# Patient Record
Sex: Male | Born: 1935 | Race: White | Hispanic: No | Smoking: Never smoker
Health system: Southern US, Community
[De-identification: ages and names within clinical notes are randomized; demographics above are authoritative.]

## PROBLEM LIST (undated history)

## (undated) DIAGNOSIS — T300 Burn of unspecified body region, unspecified degree: Secondary | ICD-10-CM

## (undated) DIAGNOSIS — I509 Heart failure, unspecified: Secondary | ICD-10-CM

## (undated) DIAGNOSIS — I10 Essential (primary) hypertension: Secondary | ICD-10-CM

## (undated) DIAGNOSIS — I739 Peripheral vascular disease, unspecified: Secondary | ICD-10-CM

## (undated) DIAGNOSIS — I4891 Unspecified atrial fibrillation: Secondary | ICD-10-CM

## (undated) DIAGNOSIS — I2510 Atherosclerotic heart disease of native coronary artery without angina pectoris: Secondary | ICD-10-CM

## (undated) DIAGNOSIS — E785 Hyperlipidemia, unspecified: Secondary | ICD-10-CM

## (undated) HISTORY — PX: SKIN GRAFT: SHX250

---

## 2008-12-03 DIAGNOSIS — D696 Thrombocytopenia, unspecified: Secondary | ICD-10-CM | POA: Diagnosis present

## 2009-06-22 ENCOUNTER — Emergency Department (HOSPITAL_COMMUNITY): Admission: EM | Admit: 2009-06-22 | Discharge: 2009-06-22 | Payer: Self-pay | Admitting: Family Medicine

## 2014-04-29 HISTORY — PX: SUPRAVALVULAR AORTIC STENOSIS REPAIR: SHX2475

## 2015-01-21 ENCOUNTER — Encounter (HOSPITAL_COMMUNITY): Payer: Self-pay

## 2015-01-21 ENCOUNTER — Emergency Department (HOSPITAL_COMMUNITY): Payer: Medicare Other

## 2015-01-21 ENCOUNTER — Inpatient Hospital Stay (HOSPITAL_COMMUNITY)
Admission: EM | Admit: 2015-01-21 | Discharge: 2015-01-27 | DRG: 177 | Disposition: A | Discharge status: Home / Self Care | Payer: Medicare Other | Attending: Family Medicine | Admitting: Family Medicine

## 2015-01-21 DIAGNOSIS — R197 Diarrhea, unspecified: Secondary | ICD-10-CM | POA: Insufficient documentation

## 2015-01-21 DIAGNOSIS — K226 Gastro-esophageal laceration-hemorrhage syndrome: Secondary | ICD-10-CM | POA: Diagnosis present

## 2015-01-21 DIAGNOSIS — R0602 Shortness of breath: Secondary | ICD-10-CM | POA: Diagnosis not present

## 2015-01-21 DIAGNOSIS — E872 Acidosis, unspecified: Secondary | ICD-10-CM

## 2015-01-21 DIAGNOSIS — R1110 Vomiting, unspecified: Secondary | ICD-10-CM

## 2015-01-21 DIAGNOSIS — E860 Dehydration: Secondary | ICD-10-CM | POA: Diagnosis present

## 2015-01-21 DIAGNOSIS — K922 Gastrointestinal hemorrhage, unspecified: Secondary | ICD-10-CM | POA: Diagnosis present

## 2015-01-21 DIAGNOSIS — G9340 Encephalopathy, unspecified: Secondary | ICD-10-CM | POA: Diagnosis not present

## 2015-01-21 DIAGNOSIS — J690 Pneumonitis due to inhalation of food and vomit: Secondary | ICD-10-CM | POA: Diagnosis not present

## 2015-01-21 DIAGNOSIS — R1312 Dysphagia, oropharyngeal phase: Secondary | ICD-10-CM | POA: Diagnosis present

## 2015-01-21 DIAGNOSIS — R0902 Hypoxemia: Secondary | ICD-10-CM

## 2015-01-21 DIAGNOSIS — E871 Hypo-osmolality and hyponatremia: Secondary | ICD-10-CM | POA: Diagnosis present

## 2015-01-21 DIAGNOSIS — Z7982 Long term (current) use of aspirin: Secondary | ICD-10-CM

## 2015-01-21 DIAGNOSIS — E870 Hyperosmolality and hypernatremia: Secondary | ICD-10-CM | POA: Diagnosis not present

## 2015-01-21 DIAGNOSIS — E876 Hypokalemia: Secondary | ICD-10-CM | POA: Diagnosis present

## 2015-01-21 DIAGNOSIS — K529 Noninfective gastroenteritis and colitis, unspecified: Secondary | ICD-10-CM | POA: Diagnosis present

## 2015-01-21 DIAGNOSIS — Z953 Presence of xenogenic heart valve: Secondary | ICD-10-CM

## 2015-01-21 DIAGNOSIS — Z7902 Long term (current) use of antithrombotics/antiplatelets: Secondary | ICD-10-CM

## 2015-01-21 DIAGNOSIS — E785 Hyperlipidemia, unspecified: Secondary | ICD-10-CM | POA: Diagnosis present

## 2015-01-21 DIAGNOSIS — I4891 Unspecified atrial fibrillation: Secondary | ICD-10-CM

## 2015-01-21 DIAGNOSIS — I480 Paroxysmal atrial fibrillation: Secondary | ICD-10-CM | POA: Diagnosis present

## 2015-01-21 DIAGNOSIS — N179 Acute kidney failure, unspecified: Secondary | ICD-10-CM | POA: Diagnosis present

## 2015-01-21 DIAGNOSIS — K920 Hematemesis: Secondary | ICD-10-CM | POA: Insufficient documentation

## 2015-01-21 DIAGNOSIS — Z66 Do not resuscitate: Secondary | ICD-10-CM | POA: Diagnosis present

## 2015-01-21 DIAGNOSIS — J9601 Acute respiratory failure with hypoxia: Secondary | ICD-10-CM | POA: Diagnosis present

## 2015-01-21 HISTORY — DX: Unspecified atrial fibrillation: I48.91

## 2015-01-21 HISTORY — DX: Burn of unspecified body region, unspecified degree: T30.0

## 2015-01-21 HISTORY — DX: Hyperlipidemia, unspecified: E78.5

## 2015-01-21 LAB — COMPREHENSIVE METABOLIC PANEL
ALK PHOS: 63 U/L (ref 38–126)
ALT: 21 U/L (ref 17–63)
AST: 31 U/L (ref 15–41)
Albumin: 3.6 g/dL (ref 3.5–5.0)
Anion gap: 13 (ref 5–15)
BILIRUBIN TOTAL: 1.3 mg/dL — AB (ref 0.3–1.2)
BUN: 25 mg/dL — ABNORMAL HIGH (ref 6–20)
CALCIUM: 8.4 mg/dL — AB (ref 8.9–10.3)
CO2: 19 mmol/L — AB (ref 22–32)
CREATININE: 1.32 mg/dL — AB (ref 0.61–1.24)
Chloride: 107 mmol/L (ref 101–111)
GFR calc non Af Amer: 46 mL/min — ABNORMAL LOW (ref >60)
GFR, EST AFRICAN AMERICAN: 54 mL/min — AB (ref >60)
Glucose, Bld: 196 mg/dL — ABNORMAL HIGH (ref 65–99)
Potassium: 3.4 mmol/L — ABNORMAL LOW (ref 3.5–5.1)
SODIUM: 139 mmol/L (ref 135–145)
Total Protein: 6.7 g/dL (ref 6.5–8.1)

## 2015-01-21 LAB — CBC WITH DIFFERENTIAL/PLATELET
Basophils Absolute: 0 10*3/uL (ref 0.0–0.1)
Basophils Relative: 0 %
EOS ABS: 0 10*3/uL (ref 0.0–0.7)
Eosinophils Relative: 0 %
HEMATOCRIT: 46.8 % (ref 39.0–52.0)
HEMOGLOBIN: 15.7 g/dL (ref 13.0–17.0)
LYMPHS ABS: 0.3 10*3/uL — AB (ref 0.7–4.0)
LYMPHS PCT: 7 %
MCH: 31.3 pg (ref 26.0–34.0)
MCHC: 33.5 g/dL (ref 30.0–36.0)
MCV: 93.2 fL (ref 78.0–100.0)
Monocytes Absolute: 0.1 10*3/uL (ref 0.1–1.0)
Monocytes Relative: 3 %
NEUTROS ABS: 4 10*3/uL (ref 1.7–7.7)
NEUTROS PCT: 90 %
Platelets: 133 10*3/uL — ABNORMAL LOW (ref 150–400)
RBC: 5.02 MIL/uL (ref 4.22–5.81)
RDW: 13.9 % (ref 11.5–15.5)
WBC: 4.5 10*3/uL (ref 4.0–10.5)

## 2015-01-21 LAB — POC OCCULT BLOOD, ED: Fecal Occult Bld: POSITIVE — AB

## 2015-01-21 LAB — TYPE AND SCREEN
ABO/RH(D): O POS
Antibody Screen: NEGATIVE

## 2015-01-21 LAB — I-STAT CG4 LACTIC ACID, ED: LACTIC ACID, VENOUS: 3.39 mmol/L — AB (ref 0.5–2.0)

## 2015-01-21 LAB — LIPASE, BLOOD: Lipase: 42 U/L (ref 22–51)

## 2015-01-21 MED ORDER — SODIUM CHLORIDE 0.9 % IV SOLN
80.0000 mg | Freq: Once | INTRAVENOUS | Status: AC
  Administered 2015-01-21: 80 mg via INTRAVENOUS
  Filled 2015-01-21: qty 80

## 2015-01-21 MED ORDER — PIPERACILLIN-TAZOBACTAM 3.375 G IVPB 30 MIN
3.3750 g | Freq: Once | INTRAVENOUS | Status: DC
  Filled 2015-01-21: qty 50

## 2015-01-21 MED ORDER — ONDANSETRON HCL 4 MG/2ML IJ SOLN
4.0000 mg | Freq: Once | INTRAMUSCULAR | Status: AC
  Administered 2015-01-21: 4 mg via INTRAVENOUS
  Filled 2015-01-21: qty 2

## 2015-01-21 MED ORDER — SODIUM CHLORIDE 0.9 % IV SOLN
8.0000 mg/h | INTRAVENOUS | Status: DC
  Administered 2015-01-21 – 2015-01-22 (×2): 8 mg/h via INTRAVENOUS
  Filled 2015-01-21 (×4): qty 80

## 2015-01-21 MED ORDER — PANTOPRAZOLE SODIUM 40 MG IV SOLR: 40.0000 mg | Freq: Two times a day (BID) | INTRAVENOUS | Status: DC

## 2015-01-21 MED ORDER — SODIUM CHLORIDE 0.9 % IV BOLUS (SEPSIS)
1000.0000 mL | Freq: Once | INTRAVENOUS | Status: AC
  Administered 2015-01-21: 1000 mL via INTRAVENOUS
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
## 2015-01-21 NOTE — ED Notes (Signed)
Per EMS pt had sudden onset of n/v/d at 1800; Pt had family member visit this past week with virus with similar symptoms; pt became incontinent of bowel; EMS witnessed bloody emesis on arrival; Pt has low sats in route and placed on 2 L/M 02 for precaution; Pt a&o on arrival; Pt has severe SOB on arrival and placed on non re breather for sats to get to 90%; Respiratory paged on arrival. Pt is currrently in afib; Pt has hx of dementia per family at bedside. Family states new onset of afib no hx known.

## 2015-01-21 NOTE — ED Notes (Signed)
Patient transported to X-ray 

## 2015-01-21 NOTE — ED Notes (Signed)
MD at bedside. 

## 2015-01-22 ENCOUNTER — Encounter (HOSPITAL_COMMUNITY): Payer: Self-pay | Admitting: Internal Medicine

## 2015-01-22 DIAGNOSIS — Z66 Do not resuscitate: Secondary | ICD-10-CM | POA: Diagnosis present

## 2015-01-22 DIAGNOSIS — Z953 Presence of xenogenic heart valve: Secondary | ICD-10-CM | POA: Diagnosis not present

## 2015-01-22 DIAGNOSIS — K922 Gastrointestinal hemorrhage, unspecified: Secondary | ICD-10-CM | POA: Diagnosis present

## 2015-01-22 DIAGNOSIS — J690 Pneumonitis due to inhalation of food and vomit: Secondary | ICD-10-CM | POA: Diagnosis present

## 2015-01-22 DIAGNOSIS — E871 Hypo-osmolality and hyponatremia: Secondary | ICD-10-CM | POA: Diagnosis present

## 2015-01-22 DIAGNOSIS — E876 Hypokalemia: Secondary | ICD-10-CM | POA: Diagnosis present

## 2015-01-22 DIAGNOSIS — Z7902 Long term (current) use of antithrombotics/antiplatelets: Secondary | ICD-10-CM | POA: Diagnosis not present

## 2015-01-22 DIAGNOSIS — R1312 Dysphagia, oropharyngeal phase: Secondary | ICD-10-CM | POA: Diagnosis present

## 2015-01-22 DIAGNOSIS — E860 Dehydration: Secondary | ICD-10-CM | POA: Diagnosis present

## 2015-01-22 DIAGNOSIS — E872 Acidosis: Secondary | ICD-10-CM

## 2015-01-22 DIAGNOSIS — G9340 Encephalopathy, unspecified: Secondary | ICD-10-CM | POA: Diagnosis not present

## 2015-01-22 DIAGNOSIS — E785 Hyperlipidemia, unspecified: Secondary | ICD-10-CM | POA: Diagnosis present

## 2015-01-22 DIAGNOSIS — R0602 Shortness of breath: Secondary | ICD-10-CM | POA: Diagnosis present

## 2015-01-22 DIAGNOSIS — I4891 Unspecified atrial fibrillation: Secondary | ICD-10-CM | POA: Diagnosis present

## 2015-01-22 DIAGNOSIS — I480 Paroxysmal atrial fibrillation: Secondary | ICD-10-CM | POA: Diagnosis present

## 2015-01-22 DIAGNOSIS — J9601 Acute respiratory failure with hypoxia: Secondary | ICD-10-CM | POA: Diagnosis present

## 2015-01-22 DIAGNOSIS — R110 Nausea: Secondary | ICD-10-CM

## 2015-01-22 DIAGNOSIS — Z7982 Long term (current) use of aspirin: Secondary | ICD-10-CM | POA: Diagnosis not present

## 2015-01-22 DIAGNOSIS — E870 Hyperosmolality and hypernatremia: Secondary | ICD-10-CM | POA: Diagnosis not present

## 2015-01-22 DIAGNOSIS — K529 Noninfective gastroenteritis and colitis, unspecified: Secondary | ICD-10-CM | POA: Diagnosis present

## 2015-01-22 DIAGNOSIS — N179 Acute kidney failure, unspecified: Secondary | ICD-10-CM | POA: Diagnosis present

## 2015-01-22 DIAGNOSIS — R197 Diarrhea, unspecified: Secondary | ICD-10-CM | POA: Insufficient documentation

## 2015-01-22 DIAGNOSIS — K226 Gastro-esophageal laceration-hemorrhage syndrome: Secondary | ICD-10-CM | POA: Diagnosis present

## 2015-01-22 DIAGNOSIS — K920 Hematemesis: Secondary | ICD-10-CM | POA: Insufficient documentation

## 2015-01-22 LAB — CBC
HCT: 42.4 % (ref 39.0–52.0)
HCT: 43.5 % (ref 39.0–52.0)
Hemoglobin: 14.1 g/dL (ref 13.0–17.0)
Hemoglobin: 14.1 g/dL (ref 13.0–17.0)
MCH: 30.9 pg (ref 26.0–34.0)
MCH: 30.9 pg (ref 26.0–34.0)
MCHC: 33.3 g/dL (ref 30.0–36.0)
MCHC: 32.4 g/dL (ref 30.0–36.0)
MCV: 92.8 fL (ref 78.0–100.0)
MCV: 95.4 fL (ref 78.0–100.0)
PLATELETS: 123 10*3/uL — AB (ref 150–400)
PLATELETS: 125 10*3/uL — AB (ref 150–400)
RBC: 4.57 MIL/uL (ref 4.22–5.81)
RBC: 4.56 MIL/uL (ref 4.22–5.81)
RDW: 13.8 % (ref 11.5–15.5)
RDW: 14.4 % (ref 11.5–15.5)
WBC: 2.8 10*3/uL — AB (ref 4.0–10.5)
WBC: 7.4 10*3/uL (ref 4.0–10.5)

## 2015-01-22 LAB — MAGNESIUM: MAGNESIUM: 1.6 mg/dL — AB (ref 1.7–2.4)

## 2015-01-22 LAB — COMPREHENSIVE METABOLIC PANEL
ALBUMIN: 2.9 g/dL — AB (ref 3.5–5.0)
ALK PHOS: 45 U/L (ref 38–126)
ALT: 16 U/L — AB (ref 17–63)
ANION GAP: 10 (ref 5–15)
AST: 28 U/L (ref 15–41)
BUN: 23 mg/dL — ABNORMAL HIGH (ref 6–20)
CALCIUM: 7.6 mg/dL — AB (ref 8.9–10.3)
CHLORIDE: 111 mmol/L (ref 101–111)
CO2: 19 mmol/L — AB (ref 22–32)
Creatinine, Ser: 1.31 mg/dL — ABNORMAL HIGH (ref 0.61–1.24)
GFR calc Af Amer: 54 mL/min — ABNORMAL LOW (ref >60)
GFR calc non Af Amer: 47 mL/min — ABNORMAL LOW (ref >60)
GLUCOSE: 139 mg/dL — AB (ref 65–99)
Potassium: 3.1 mmol/L — ABNORMAL LOW (ref 3.5–5.1)
SODIUM: 140 mmol/L (ref 135–145)
Total Bilirubin: 0.9 mg/dL (ref 0.3–1.2)
Total Protein: 5.6 g/dL — ABNORMAL LOW (ref 6.5–8.1)

## 2015-01-22 LAB — STREP PNEUMONIAE URINARY ANTIGEN: Strep Pneumo Urinary Antigen: NEGATIVE

## 2015-01-22 LAB — PHOSPHORUS: PHOSPHORUS: 2 mg/dL — AB (ref 2.5–4.6)

## 2015-01-22 LAB — LACTIC ACID, PLASMA
LACTIC ACID, VENOUS: 4.2 mmol/L — AB (ref 0.5–2.0)
LACTIC ACID, VENOUS: 4.6 mmol/L — AB (ref 0.5–2.0)

## 2015-01-22 LAB — ABO/RH: ABO/RH(D): O POS

## 2015-01-22 LAB — MRSA PCR SCREENING: MRSA BY PCR: NEGATIVE

## 2015-01-22 LAB — C DIFFICILE QUICK SCREEN W PCR REFLEX
C DIFFICLE (CDIFF) ANTIGEN: NEGATIVE
C Diff interpretation: NEGATIVE
C Diff toxin: NEGATIVE

## 2015-01-22 LAB — GLUCOSE, CAPILLARY
Glucose-Capillary: 102 mg/dL — ABNORMAL HIGH (ref 65–99)
Glucose-Capillary: 177 mg/dL — ABNORMAL HIGH (ref 65–99)

## 2015-01-22 MED ORDER — PIPERACILLIN-TAZOBACTAM 3.375 G IVPB 30 MIN
3.3750 g | Freq: Once | INTRAVENOUS | Status: AC
  Administered 2015-01-22: 3.375 g via INTRAVENOUS
  Filled 2015-01-22: qty 50

## 2015-01-22 MED ORDER — VANCOMYCIN HCL IN DEXTROSE 1-5 GM/200ML-% IV SOLN
1000.0000 mg | INTRAVENOUS | Status: DC
  Administered 2015-01-23 – 2015-01-25 (×3): 1000 mg via INTRAVENOUS
  Filled 2015-01-22 (×4): qty 200

## 2015-01-22 MED ORDER — LEVALBUTEROL HCL 1.25 MG/0.5ML IN NEBU
1.2500 mg | INHALATION_SOLUTION | Freq: Four times a day (QID) | RESPIRATORY_TRACT | Status: DC
  Filled 2015-01-22 (×2): qty 0.5

## 2015-01-22 MED ORDER — POTASSIUM CHLORIDE IN NACL 40-0.9 MEQ/L-% IV SOLN
INTRAVENOUS | Status: AC
  Administered 2015-01-22: 100 mL/h via INTRAVENOUS
  Filled 2015-01-22: qty 1000

## 2015-01-22 MED ORDER — PANTOPRAZOLE SODIUM 40 MG IV SOLR
40.0000 mg | Freq: Two times a day (BID) | INTRAVENOUS | Status: DC
  Administered 2015-01-22 – 2015-01-26 (×10): 40 mg via INTRAVENOUS
  Filled 2015-01-22 (×10): qty 40

## 2015-01-22 MED ORDER — MAGNESIUM SULFATE 2 GM/50ML IV SOLN
2.0000 g | Freq: Once | INTRAVENOUS | Status: AC
  Administered 2015-01-22: 2 g via INTRAVENOUS
  Filled 2015-01-22: qty 50

## 2015-01-22 MED ORDER — RESOURCE THICKENUP CLEAR PO POWD
ORAL | Status: DC | PRN
  Filled 2015-01-22: qty 125

## 2015-01-22 MED ORDER — LOPERAMIDE HCL 2 MG PO CAPS
4.0000 mg | ORAL_CAPSULE | Freq: Three times a day (TID) | ORAL | Status: DC | PRN
  Administered 2015-01-22: 4 mg via ORAL
  Filled 2015-01-22: qty 2

## 2015-01-22 MED ORDER — CETYLPYRIDINIUM CHLORIDE 0.05 % MT LIQD
7.0000 mL | Freq: Two times a day (BID) | OROMUCOSAL | Status: DC
  Administered 2015-01-22 – 2015-01-27 (×11): 7 mL via OROMUCOSAL

## 2015-01-22 MED ORDER — VANCOMYCIN HCL 10 G IV SOLR
1500.0000 mg | INTRAVENOUS | Status: AC
  Administered 2015-01-22: 1500 mg via INTRAVENOUS
  Filled 2015-01-22 (×2): qty 1500

## 2015-01-22 MED ORDER — POTASSIUM CHLORIDE CRYS ER 20 MEQ PO TBCR
60.0000 meq | EXTENDED_RELEASE_TABLET | Freq: Once | ORAL | Status: AC
  Administered 2015-01-22: 60 meq via ORAL
  Filled 2015-01-22: qty 3

## 2015-01-22 MED ORDER — PIPERACILLIN-TAZOBACTAM 3.375 G IVPB
3.3750 g | Freq: Three times a day (TID) | INTRAVENOUS | Status: DC
  Administered 2015-01-22 – 2015-01-26 (×13): 3.375 g via INTRAVENOUS
  Filled 2015-01-22 (×15): qty 50

## 2015-01-22 MED ORDER — SODIUM CHLORIDE 0.9 % IV SOLN
INTRAVENOUS | Status: DC
Start: 2015-01-22 — End: 2015-01-23
  Administered 2015-01-22 (×2): via INTRAVENOUS

## 2015-01-22 MED ORDER — SODIUM CHLORIDE 0.9 % IV BOLUS (SEPSIS)
500.0000 mL | Freq: Once | INTRAVENOUS | Status: AC
  Administered 2015-01-22: 500 mL via INTRAVENOUS

## 2015-01-22 MED ORDER — LEVALBUTEROL HCL 1.25 MG/0.5ML IN NEBU
1.2500 mg | INHALATION_SOLUTION | Freq: Four times a day (QID) | RESPIRATORY_TRACT | Status: DC
  Administered 2015-01-22 – 2015-01-24 (×10): 1.25 mg via RESPIRATORY_TRACT
  Filled 2015-01-22 (×9): qty 0.5

## 2015-01-22 MED ORDER — IPRATROPIUM BROMIDE 0.02 % IN SOLN
0.5000 mg | Freq: Four times a day (QID) | RESPIRATORY_TRACT | Status: DC
  Administered 2015-01-22 – 2015-01-24 (×10): 0.5 mg via RESPIRATORY_TRACT
  Filled 2015-01-22 (×10): qty 2.5

## 2015-01-22 NOTE — Progress Notes (Signed)
CRITICAL VALUE ALERT  Critical value received: lactic Acid 4.2  Date of notification: 01/22/15  Time of notification:  1055am  Critical value read back:yes  Nurse who received alert: Arman Bogus Rn  MD notified (1st page): Dr Rito Ehrlich Via Text page
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
## 2015-01-22 NOTE — ED Notes (Signed)
MD at bedside. 

## 2015-01-22 NOTE — Progress Notes (Signed)
Utilization review completed.  
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
## 2015-01-23 ENCOUNTER — Inpatient Hospital Stay (HOSPITAL_COMMUNITY): Payer: Medicare Other

## 2015-01-23 DIAGNOSIS — I4891 Unspecified atrial fibrillation: Secondary | ICD-10-CM

## 2015-01-23 DIAGNOSIS — G9340 Encephalopathy, unspecified: Secondary | ICD-10-CM

## 2015-01-23 LAB — GLUCOSE, CAPILLARY
GLUCOSE-CAPILLARY: 143 mg/dL — AB (ref 65–99)
Glucose-Capillary: 90 mg/dL (ref 65–99)
Glucose-Capillary: 101 mg/dL — ABNORMAL HIGH (ref 65–99)

## 2015-01-23 LAB — CBC
HCT: 40.3 % (ref 39.0–52.0)
Hemoglobin: 13.1 g/dL (ref 13.0–17.0)
MCH: 30.5 pg (ref 26.0–34.0)
MCHC: 32.5 g/dL (ref 30.0–36.0)
MCV: 93.9 fL (ref 78.0–100.0)
Platelets: 120 10*3/uL — ABNORMAL LOW (ref 150–400)
RBC: 4.29 MIL/uL (ref 4.22–5.81)
RDW: 14.5 % (ref 11.5–15.5)
WBC: 7.4 10*3/uL (ref 4.0–10.5)

## 2015-01-23 LAB — BASIC METABOLIC PANEL
Anion gap: 6 (ref 5–15)
Anion gap: 8 (ref 5–15)
BUN: 22 mg/dL — AB (ref 6–20)
BUN: 22 mg/dL — AB (ref 6–20)
CALCIUM: 8.4 mg/dL — AB (ref 8.9–10.3)
CHLORIDE: 128 mmol/L — AB (ref 101–111)
CHLORIDE: 122 mmol/L — AB (ref 101–111)
CO2: 18 mmol/L — AB (ref 22–32)
CO2: 19 mmol/L — ABNORMAL LOW (ref 22–32)
CREATININE: 1.3 mg/dL — AB (ref 0.61–1.24)
CREATININE: 1.22 mg/dL (ref 0.61–1.24)
Calcium: 8.2 mg/dL — ABNORMAL LOW (ref 8.9–10.3)
GFR calc Af Amer: 59 mL/min — ABNORMAL LOW (ref >60)
GFR calc non Af Amer: 47 mL/min — ABNORMAL LOW (ref >60)
GFR calc non Af Amer: 51 mL/min — ABNORMAL LOW (ref >60)
GFR, EST AFRICAN AMERICAN: 55 mL/min — AB (ref >60)
GLUCOSE: 134 mg/dL — AB (ref 65–99)
Glucose, Bld: 127 mg/dL — ABNORMAL HIGH (ref 65–99)
POTASSIUM: 3.3 mmol/L — AB (ref 3.5–5.1)
Potassium: 3.7 mmol/L (ref 3.5–5.1)
SODIUM: 152 mmol/L — AB (ref 135–145)
SODIUM: 149 mmol/L — AB (ref 135–145)

## 2015-01-23 LAB — LEGIONELLA PNEUMOPHILA SEROGP 1 UR AG: L. pneumophila Serogp 1 Ur Ag: NEGATIVE

## 2015-01-23 LAB — LACTIC ACID, PLASMA: LACTIC ACID, VENOUS: 2.2 mmol/L — AB (ref 0.5–2.0)

## 2015-01-23 MED ORDER — LORAZEPAM 2 MG/ML IJ SOLN
1.0000 mg | Freq: Once | INTRAMUSCULAR | Status: AC
  Administered 2015-01-23: 1 mg via INTRAVENOUS
  Filled 2015-01-23: qty 1

## 2015-01-23 MED ORDER — LOPERAMIDE HCL 2 MG PO CAPS
4.0000 mg | ORAL_CAPSULE | Freq: Three times a day (TID) | ORAL | Status: AC
  Administered 2015-01-23 – 2015-01-24 (×5): 4 mg via ORAL
  Filled 2015-01-23 (×5): qty 2

## 2015-01-23 MED ORDER — POTASSIUM CHLORIDE 10 MEQ/100ML IV SOLN
10.0000 meq | INTRAVENOUS | Status: AC
  Administered 2015-01-23 (×4): 10 meq via INTRAVENOUS
  Filled 2015-01-23 (×4): qty 100

## 2015-01-23 MED ORDER — SODIUM CHLORIDE 0.45 % IV SOLN
INTRAVENOUS | Status: DC
  Administered 2015-01-23 – 2015-01-25 (×3): via INTRAVENOUS
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
## 2015-01-23 NOTE — Progress Notes (Signed)
Patient very sleepy. Chaplain asked if he would like her to come back another time; he nodded yes.

## 2015-01-23 NOTE — Progress Notes (Signed)
Speech Pathology    MBSS complete. Full report located under chart review in imaging section. Double click on DG swallow function.  Recommend: Continue Dys 1 (puree) and nectar thick liquids, SWALLOW 2 TIMES AFTER FOOD/LIQUID, ALTERNATE SOLIDS AND LIQUIDS.    Breck Coons [place].Ed [employer] [phone]

## 2015-01-23 NOTE — Progress Notes (Signed)
Echocardiogram 2D Echocardiogram has been performed.  Max Martinez 01/23/2015, 12:33 PM

## 2015-01-24 DIAGNOSIS — I4891 Unspecified atrial fibrillation: Secondary | ICD-10-CM

## 2015-01-24 LAB — BASIC METABOLIC PANEL
ANION GAP: 8 (ref 5–15)
BUN: 16 mg/dL (ref 6–20)
CO2: 20 mmol/L — ABNORMAL LOW (ref 22–32)
Calcium: 8 mg/dL — ABNORMAL LOW (ref 8.9–10.3)
Chloride: 116 mmol/L — ABNORMAL HIGH (ref 101–111)
Creatinine, Ser: 1.07 mg/dL (ref 0.61–1.24)
GFR calc Af Amer: >60 mL/min (ref >60)
GFR, EST NON AFRICAN AMERICAN: 60 mL/min — AB (ref >60)
GLUCOSE: 108 mg/dL — AB (ref 65–99)
POTASSIUM: 3.5 mmol/L (ref 3.5–5.1)
Sodium: 144 mmol/L (ref 135–145)

## 2015-01-24 LAB — CBC
HEMATOCRIT: 38.7 % — AB (ref 39.0–52.0)
Hemoglobin: 12.7 g/dL — ABNORMAL LOW (ref 13.0–17.0)
MCH: 30.7 pg (ref 26.0–34.0)
MCHC: 32.8 g/dL (ref 30.0–36.0)
MCV: 93.5 fL (ref 78.0–100.0)
PLATELETS: 119 10*3/uL — AB (ref 150–400)
RBC: 4.14 MIL/uL — AB (ref 4.22–5.81)
RDW: 14.6 % (ref 11.5–15.5)
WBC: 9.3 10*3/uL (ref 4.0–10.5)

## 2015-01-24 LAB — GLUCOSE, CAPILLARY
GLUCOSE-CAPILLARY: 99 mg/dL (ref 65–99)
Glucose-Capillary: 77 mg/dL (ref 65–99)
Glucose-Capillary: 92 mg/dL (ref 65–99)

## 2015-01-24 MED ORDER — LEVALBUTEROL HCL 1.25 MG/0.5ML IN NEBU: 1.2500 mg | INHALATION_SOLUTION | Freq: Four times a day (QID) | RESPIRATORY_TRACT | Status: DC | PRN

## 2015-01-24 MED ORDER — METOPROLOL TARTRATE 25 MG PO TABS
25.0000 mg | ORAL_TABLET | Freq: Two times a day (BID) | ORAL | Status: DC
  Administered 2015-01-24 – 2015-01-27 (×7): 25 mg via ORAL
  Filled 2015-01-24 (×7): qty 1

## 2015-01-24 MED ORDER — IPRATROPIUM BROMIDE 0.02 % IN SOLN: 0.5000 mg | Freq: Four times a day (QID) | RESPIRATORY_TRACT | Status: DC | PRN

## 2015-01-24 MED ORDER — POTASSIUM CHLORIDE 10 MEQ/100ML IV SOLN
10.0000 meq | INTRAVENOUS | Status: AC
  Administered 2015-01-24 (×4): 10 meq via INTRAVENOUS
  Filled 2015-01-24 (×4): qty 100
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
## 2015-01-24 NOTE — Progress Notes (Signed)
Speech Language Pathology Treatment: Dysphagia  Patient Details Name: Max Martinez MRN: [id number] DOB: 06/06/1924 Today's Date: 01/24/2015 Time: 4098-1191 SLP Time Calculation (min) (ACUTE ONLY): 21 min  Assessment / Plan / Recommendation Clinical Impression  Restraint mitt removed providing pt max verbal and tactile cues for self feeding and rate and bite/sip size regulation. He required verbal reminders after each bite/sip throughout meal for second swallow not sensing pharyngeal residue (as seen on MBS) and able to implement once cued. Delayed cough x 3 indicative of likely penetration with hopeful  partial or full expulsion with reflexive cough. Puree texture and nectar thick liquids appropriate to continue with FULL supervision as pt unable to follow strategies without reminders.    HPI Other Pertinent Information: Max Martinez is a 81 y.o. male with a past medical history of status post aortic stenosis repair earlier this year, hyperlipidemia, BPH who was brought to the emergency department due to abdominal pain, nausea, several episodes of emesis and diarrhea. Just 3 days before, his grandson had similar symptoms that lasted less than 24 hours and were self-limited. Per daughter, there seemed to be blood in one of the emesis episodes and EMS witnessed an episode of hematemesis on the way to the hospital. He was also hypoxic and required nonrebreather mask to raise his oxygen to normal levels. His daughter states, that prior to the emesis episodes he was breathing normally. In the ER, he was noticed to be on atrial fibrillation, which the daughter is not aware to be a chronic problem. He had aortic valve replacement surgery about 6 months ago. To the patient's daughter's knowledge, he does not have any other cardiac history. He is currently in no acute distress, but is requiring high FiO2 to maintain oxygen saturations.   Pertinent Vitals Pain Assessment: No/denies pain  SLP Plan  Continue  with current plan of care    Recommendations Diet recommendations: Dysphagia 1 (puree);Nectar-thick liquid Liquids provided via: Cup;No straw Medication Administration: Crushed with puree Supervision: Patient able to self feed;Full supervision/cueing for compensatory strategies Compensations: Slow rate;Small sips/bites;Check for pocketing;Multiple dry swallows after each bite/sip;Follow solids with liquid Postural Changes and/or Swallow Maneuvers: Seated upright 90 degrees              Oral Care Recommendations: Oral care BID Follow up Recommendations: Skilled Nursing facility Plan: Continue with current plan of care    GO     Royce Macadamia 01/24/2015, 9:12 AM   Breck Coons Lonell Face.Ed [employer] [phone]

## 2015-01-25 DIAGNOSIS — J690 Pneumonitis due to inhalation of food and vomit: Principal | ICD-10-CM

## 2015-01-25 LAB — CBC
HEMATOCRIT: 36.4 % — AB (ref 39.0–52.0)
HEMOGLOBIN: 12.3 g/dL — AB (ref 13.0–17.0)
MCH: 31.5 pg (ref 26.0–34.0)
MCHC: 33.8 g/dL (ref 30.0–36.0)
MCV: 93.1 fL (ref 78.0–100.0)
Platelets: 114 10*3/uL — ABNORMAL LOW (ref 150–400)
RBC: 3.91 MIL/uL — AB (ref 4.22–5.81)
RDW: 14.5 % (ref 11.5–15.5)
WBC: 9.5 10*3/uL (ref 4.0–10.5)

## 2015-01-25 LAB — GLUCOSE, CAPILLARY
GLUCOSE-CAPILLARY: 104 mg/dL — AB (ref 65–99)
GLUCOSE-CAPILLARY: 90 mg/dL (ref 65–99)
GLUCOSE-CAPILLARY: 92 mg/dL (ref 65–99)

## 2015-01-25 LAB — BASIC METABOLIC PANEL
ANION GAP: 6 (ref 5–15)
BUN: 20 mg/dL (ref 6–20)
CHLORIDE: 114 mmol/L — AB (ref 101–111)
CO2: 21 mmol/L — AB (ref 22–32)
CREATININE: 1.07 mg/dL (ref 0.61–1.24)
Calcium: 7.7 mg/dL — ABNORMAL LOW (ref 8.9–10.3)
GFR calc non Af Amer: 60 mL/min — ABNORMAL LOW (ref >60)
Glucose, Bld: 115 mg/dL — ABNORMAL HIGH (ref 65–99)
POTASSIUM: 3.5 mmol/L (ref 3.5–5.1)
SODIUM: 141 mmol/L (ref 135–145)

## 2015-01-25 LAB — PHOSPHORUS: PHOSPHORUS: 2.7 mg/dL (ref 2.5–4.6)

## 2015-01-25 MED ORDER — CLOPIDOGREL BISULFATE 75 MG PO TABS
75.0000 mg | ORAL_TABLET | Freq: Every day | ORAL | Status: DC
  Administered 2015-01-26 – 2015-01-27 (×2): 75 mg via ORAL
  Filled 2015-01-25 (×2): qty 1

## 2015-01-25 MED ORDER — INFLUENZA VAC SPLIT QUAD 0.5 ML IM SUSY
0.5000 mL | PREFILLED_SYRINGE | INTRAMUSCULAR | Status: AC
  Administered 2015-01-26: 0.5 mL via INTRAMUSCULAR
  Filled 2015-01-25: qty 0.5

## 2015-01-25 NOTE — Care Management Important Message (Signed)
Important Message  Patient Details  Name: ELDEAN NANNA MRN: [id number] Date of Birth: Jun 27, 1924   Medicare Important Message Given:  Yes-second notification given    Kyla Balzarine 01/25/2015, 11:41 AM

## 2015-01-25 NOTE — Progress Notes (Signed)
ANTIBIOTIC CONSULT NOTE - FOLLOW UP  Pharmacy Consult:  Vancomycin / Zosyn Indication:  PNA  No Known Allergies  Patient Measurements: Height:  (180.3 cm) Weight: 154 lb 8.7 oz (70.1 kg) IBW/kg (Calculated) : 75.3  Vital Signs: Temp: 98 F (36.7 C) (09/28 0800) Temp Source: Oral (09/28 0800) BP: 114/66 mmHg (09/28 0947) Pulse Rate: 66 (09/28 0947) Intake/Output from previous day: 09/27 0701 - 09/28 0700 In: 1990.8 [P.O.:480; I.V.:1160.8; IV Piggyback:350] Out: 1000 [Urine:1000] Intake/Output from this shift: Total I/O In: 100 [I.V.:100] Out: -   Labs:  Recent Labs  01/23/15 0257 01/23/15 0926 01/24/15 0952 01/25/15 0318  WBC 7.4  --  9.3 9.5  HGB 13.1  --  12.7* 12.3*  PLT 120*  --  119* 114*  CREATININE 1.30* 1.22 1.07 1.07   Estimated Creatinine Clearance: 47.3 mL/min (by C-G formula based on Cr of 1.07). No results for input(s): VANCOTROUGH, VANCOPEAK, VANCORANDOM, GENTTROUGH, GENTPEAK, GENTRANDOM, TOBRATROUGH, TOBRAPEAK, TOBRARND, AMIKACINPEAK, AMIKACINTROU, AMIKACIN in the last 72 hours.   Microbiology: Recent Results (from the past 720 hour(s))  MRSA PCR Screening     Status: None   Collection Time: 01/22/15  1:00 AM  Result Value Ref Range Status   MRSA by PCR NEGATIVE NEGATIVE Final    Comment:        The GeneXpert MRSA Assay (FDA approved for NASAL specimens only), is one component of a comprehensive MRSA colonization surveillance program. It is not intended to diagnose MRSA infection nor to guide or monitor treatment for MRSA infections.   Culture, blood (routine x 2) Call MD if unable to obtain prior to antibiotics being given     Status: None (Preliminary result)   Collection Time: 01/22/15  2:32 AM  Result Value Ref Range Status   Specimen Description BLOOD RIGHT HAND  Final   Special Requests BOTTLES DRAWN AEROBIC ONLY 5CC  Final   Culture NO GROWTH 2 DAYS  Final   Report Status PENDING  Incomplete  Culture, blood (routine x 2)  Call MD if unable to obtain prior to antibiotics being given     Status: None (Preliminary result)   Collection Time: 01/22/15  2:36 AM  Result Value Ref Range Status   Specimen Description BLOOD LEFT HAND  Final   Special Requests IN PEDIATRIC BOTTLE 1CC  Final   Culture NO GROWTH 2 DAYS  Final   Report Status PENDING  Incomplete  C difficile quick scan w PCR reflex     Status: None   Collection Time: 01/22/15  1:51 PM  Result Value Ref Range Status   C Diff antigen NEGATIVE NEGATIVE Final   C Diff toxin NEGATIVE NEGATIVE Final   C Diff interpretation Negative for toxigenic C. difficile  Final  Stool culture     Status: None (Preliminary result)   Collection Time: 01/22/15  1:52 PM  Result Value Ref Range Status   Specimen Description STOOL  Final   Special Requests NONE  Final   Culture   Final    NO SUSPICIOUS COLONIES, CONTINUING TO HOLD Performed at [facility]    Report Status PENDING  Incomplete      Assessment: 89 YOM with PNA to continue on vancomycin and Zosyn.  Patient's renal function has been improving.  Vanc 9/25 >> Zosyn 9/25 >>  9/25 BCx x2 - NGTD 9/25 C.diff - negative   Goal of Therapy:  Vancomycin trough level 15-20 mcg/ml   Plan:  - Vanc 1gm IV Q24H - Zosyn  3.375gm IV Q8H, 4 hr infusion - Monitor renal fxn, clinical progress, check vanc trough tomorrow since antibiotics haven't been de-escalated - F/U resume home meds, change PPI to PO (defer to MD given GIB)    Lowana Hable D. Laney Potash, PharmD, BCPS Pager:  [phone] 01/25/2015, 9:55 AM
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
## 2015-01-26 DIAGNOSIS — J690 Pneumonitis due to inhalation of food and vomit: Secondary | ICD-10-CM | POA: Diagnosis not present

## 2015-01-26 LAB — GLUCOSE, CAPILLARY
Glucose-Capillary: 85 mg/dL (ref 65–99)
Glucose-Capillary: 101 mg/dL — ABNORMAL HIGH (ref 65–99)

## 2015-01-26 LAB — STOOL CULTURE

## 2015-01-26 LAB — VANCOMYCIN, TROUGH: Vancomycin Tr: 8 ug/mL — ABNORMAL LOW (ref 10.0–20.0)

## 2015-01-26 MED ORDER — VANCOMYCIN HCL IN DEXTROSE 750-5 MG/150ML-% IV SOLN
750.0000 mg | Freq: Two times a day (BID) | INTRAVENOUS | Status: DC
  Administered 2015-01-26: 750 mg via INTRAVENOUS
  Filled 2015-01-26 (×2): qty 150

## 2015-01-26 MED ORDER — LEVOFLOXACIN 750 MG PO TABS
750.0000 mg | ORAL_TABLET | ORAL | Status: DC
  Administered 2015-01-26: 750 mg via ORAL
  Filled 2015-01-26: qty 1

## 2015-01-26 NOTE — Progress Notes (Signed)
Patient being transferred to 5W. Report called to receiving nurse. Dinner trays just arrived. Patient's daughter would like for patient to eat before being moved.

## 2015-01-26 NOTE — Clinical Social Work Note (Signed)
Clinical Social Work Assessment  Patient Details  Name: Max Martinez MRN: [id number] Date of Birth: 02-15-1925  Date of referral:  01/26/15               Reason for consult:  Facility Placement                Permission sought to share information with:  Family Supports Permission granted to share information::  No (pt disoriented)  Name::     Max Martinez  Agency::  [facility]  Relationship::  daughter  Contact Information:     Housing/Transportation Living arrangements for the past 2 months:  Single Family Home Source of Information:    Patient Interpreter Needed:  None Criminal Activity/Legal Involvement Pertinent to Current Situation/Hospitalization:  No - Comment as needed Significant Relationships:  Adult Children Lives with:  Adult Children (dtr) Do you feel safe going back to the place where you live?  No Need for family participation in patient care:  Yes (Comment) (pt currently unable to make decisions)  Care giving concerns:  Pt lives at home with daughter- daughter unable to provide sufficient help given current level of mobility.  Pt dtr also states that she works and cannot be with pt during the day   Office manager / plan:  CSW spoke with pt daughter, Max Martinez Youth worker alongside pt other daughter), concerning medical staff recommendation for SNF  Employment status:  Retired Health and safety inspector:    PT Recommendations:  Skilled Nursing Facility Information / Referral to community resources:  Skilled Nursing Facility  Patient/Family's Response to care:  Pt dtr is agreeable to short term SNF placement given pt current level of needs and expresses preference for placement at [facility] [employer] of and Emotional Response to Diagnosis, Current Treatment, and Prognosis:  Dtr had no questions or concerns regarding pt prognosis/treatment but is hopeful that pt mental status clears soon  Emotional Assessment Appearance:  Appears stated  age Attitude/Demeanor/Rapport:  Unable to Assess Affect (typically observed):  Unable to Assess Orientation:  Oriented to Self, Fluctuating Orientation (Suspected and/or reported Sundowners) Alcohol / Substance use:  Not Applicable Psych involvement (Current and /or in the community):  No (Comment)  Discharge Needs  Concerns to be addressed:  Care Coordination Readmission within the last 30 days:  No Current discharge risk:  Physical Impairment Barriers to Discharge:  Continued Medical Work up   [employer], LCSW 01/26/2015, 12:17 PM

## 2015-01-26 NOTE — Clinical Social Work Placement (Signed)
   CLINICAL SOCIAL WORK PLACEMENT  NOTE  Date:  01/26/2015  Patient Details  Name: Max Martinez MRN: [id number] Date of Birth: November 01, 1924  Clinical Social Work is seeking post-discharge placement for this patient at the Skilled  Nursing Facility level of care (*CSW will initial, date and re-position this form in  chart as items are completed):  Yes   Patient/family provided with [place] Clinical Social Work Department's list of facilities offering this level of care within the geographic area requested by the patient (or if unable, by the patient's family).  Yes   Patient/family informed of their freedom to choose among providers that offer the needed level of care, that participate in Medicare, Medicaid or managed care program needed by the patient, have an available bed and are willing to accept the patient.  Yes   Patient/family informed of [facility]'s ownership interest in [facility] and [facility], as well as of the fact that they are under no obligation to receive care at these facilities.  PASRR submitted to EDS on 01/26/15     PASRR number received on 01/26/15     Existing PASRR number confirmed on       FL2 transmitted to all facilities in geographic area requested by pt/family on 01/26/15     FL2 transmitted to all facilities within larger geographic area on       Patient informed that his/her managed care company has contracts with or will negotiate with certain facilities, including the following:            Patient/family informed of bed offers received.  Patient chooses bed at       Physician recommends and patient chooses bed at      Patient to be transferred to   on  .  Patient to be transferred to facility by       Patient family notified on   of transfer.  Name of family member notified:        PHYSICIAN Please sign FL2, Please sign DNR     Additional Comment:    _______________________________________________ Izora Ribas,  LCSW 01/26/2015, 12:20 PM

## 2015-01-26 NOTE — Progress Notes (Signed)
ANTIBIOTIC CONSULT NOTE - FOLLOW UP  Pharmacy Consult for Vancomycin  Indication: pneumonia  Labs:  Recent Labs  01/23/15 0926 01/24/15 0952 01/25/15 0318  WBC  --  9.3 9.5  HGB  --  12.7* 12.3*  PLT  --  119* 114*  CREATININE 1.22 1.07 1.07   Estimated Creatinine Clearance: 47.3 mL/min (by C-G formula based on Cr of 1.07).  Recent Labs  01/26/15 0322  VANCOTROUGH 8*     Assessment: Sub-therapeutic vancomycin trough, Scr has trended down to baseline  Goal of Therapy:  Vancomycin trough level 15-20 mcg/ml  Plan:  -Increase vancomycin to 750 mg IV q12h -Re-check VT at steady state if not de-escalated soon  Abran Duke 01/26/2015,4:07 AM

## 2015-01-26 NOTE — Evaluation (Signed)
Occupational Therapy Evaluation Patient Details Name: Max Martinez MRN: [id number] DOB: 16-Jan-1925 Today's Date: 01/26/2015    History of Present Illness 81 y.o. male with a past medical history of status post aortic stenosis repair earlier this year, hyperlipidemia, BPH who was brought to the emergency department due to abdominal pain, nausea, several episodes of emesis and diarrhea. EMS witnessed an episode of hematemesis on the way to the hospital. He was also hypoxic and required nonrebreather mask to raise his oxygen to normal levels. In the ER, he was noticed to be on atrial fibrillation, which he has no PMH of.   Clinical Impression   Pt admitted with above. He demonstrates the below listed deficits and will benefit from continued OT to maximize safety and independence with BADLs.  Pt presents to OT with generalized weakness/deconitioning, impaired balance, and impaired cognition.  He currently requires mod - total A for ADLs.  He fatigues quickly with DOE 3-4/4, but sats remained >95% on 02.   Recommend SNF, will follow.       Follow Up Recommendations  SNF    Equipment Recommendations  None recommended by OT    Recommendations for Other Services       Precautions / Restrictions Precautions Precautions: Fall Precaution Comments: AMS Restrictions Weight Bearing Restrictions: No      Mobility Bed Mobility Overal bed mobility: Needs Assistance Bed Mobility: Supine to Sit;Sit to Supine     Supine to sit: Min guard Sit to supine: Min guard   General bed mobility comments: Heavily dependent on bedrail.  Moves slowly, requires verbal cues   Transfers Overall transfer level: Needs assistance Equipment used: Rolling walker (2 wheeled);2 person hand held assist Transfers: Sit to/from Stand Sit to Stand: Mod assist         General transfer comment: Unable to maintain standing due to fatigue     Balance Overall balance assessment: Needs assistance Sitting-balance  support: Feet supported Sitting balance-[name]: Good     Standing balance support: Bilateral upper extremity supported Standing balance-[name]: Poor Standing balance comment: Pt reliant on bil. UE support and mod A to maintain standing                             ADL Overall ADL's : Needs assistance/impaired Eating/Feeding: Set up;Sitting   Grooming: Minimal assistance;Sitting   Upper Body Bathing: Moderate assistance;Sitting   Lower Body Bathing: Maximal assistance;Sit to/from stand   Upper Body Dressing : Moderate assistance;Sitting   Lower Body Dressing: Maximal assistance;Sit to/from stand   Toilet Transfer: Moderate assistance;Stand-pivot;BSC   Toileting- Clothing Manipulation and Hygiene: Total assistance;Sit to/from stand       Functional mobility during ADLs: Moderate assistance General ADL Comments: Pt fatigues rapidly with ADL acitivity DOE 3-4/4, VSS     Vision     Perception     Praxis      Pertinent Vitals/Pain Pain Assessment: No/denies pain     Hand Dominance Right   Extremity/Trunk Assessment Upper Extremity Assessment Upper Extremity Assessment: Generalized weakness   Lower Extremity Assessment Lower Extremity Assessment: Defer to PT evaluation   Cervical / Trunk Assessment Cervical / Trunk Assessment: Normal   Communication Communication Communication: No difficulties   Cognition Arousal/Alertness: Awake/alert Behavior During Therapy: WFL for tasks assessed/performed Overall Cognitive Status: No family/caregiver present to determine baseline cognitive functioning Area of Impairment: Attention;Memory;Awareness;Problem solving Orientation Level: Disoriented to;Place;Time;Situation Current Attention Level: Sustained   Following Commands: Follows one  step commands consistently Safety/Judgement: Decreased awareness of safety;Decreased awareness of deficits   Problem Solving: Slow processing;Decreased  initiation;Requires verbal cues General Comments: Pt knows he is in hospital in [place], but is not sure which hospital (he is from [place]),  He requires information to be repeated as he will ask same questions.  Unsure of his baseline cognition and no family available.    General Comments       Exercises       Shoulder Instructions      Home Living Family/patient expects to be discharged to:: Skilled nursing facility Living Arrangements: Children Available Help at Discharge: Family;Available PRN/intermittently Type of Home: House                           Additional Comments: Pt reports he lives in [place], outside of [place].  He has been staying with daughter for past 2-3 mos.        Prior Functioning/Environment          Comments: Pt indicates he was independent and still driving, but no family present to verify information     OT Diagnosis: Generalized weakness;Cognitive deficits   OT Problem List: Decreased strength;Decreased activity tolerance;Impaired balance (sitting and/or standing);Decreased cognition;Decreased safety awareness;Decreased knowledge of use of DME or AE;Cardiopulmonary status limiting activity   OT Treatment/Interventions: Self-care/ADL training;Therapeutic exercise;DME and/or AE instruction;Energy conservation;Therapeutic activities;Cognitive remediation/compensation;Patient/family education;Balance training    OT Goals(Current goals can be found in the care plan section) Acute Rehab OT Goals Patient Stated Goal: Unable to state OT Goal Formulation: Patient unable to participate in goal setting Time For Goal Achievement: 02/09/15 Potential to Achieve Goals: Good ADL Goals Pt Will Perform Upper Body Bathing: with set-up;with supervision;sitting Pt Will Perform Lower Body Bathing: with min assist;sit to/from stand Pt Will Perform Upper Body Dressing: with set-up;with supervision;sitting Pt Will Perform Lower Body Dressing: with min assist;sit to/from  stand Pt Will Transfer to Toilet: with min assist;stand pivot transfer;bedside commode Pt Will Perform Toileting - Clothing Manipulation and hygiene: with min assist;sit to/from stand  OT Frequency: Min 2X/week   Barriers to D/C: Decreased caregiver support          Co-evaluation              End of Session Equipment Utilized During Treatment: Oxygen Nurse Communication: Mobility status  Activity Tolerance: Patient limited by fatigue Patient left: in bed;with call bell/phone within reach;with bed alarm set   Time: 1610-9604 OT Time Calculation (min): 20 min Charges:  OT General Charges $OT Visit: 1 Procedure OT Evaluation $Initial OT Evaluation Tier I: 1 Procedure G-Codes:    Conarpe, Ursula Alert M February 08, 2015, 2:11 PM
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
## 2015-01-26 NOTE — Evaluation (Signed)
Physical Therapy Evaluation Patient Details Name: Max Martinez MRN: [id number] DOB: 20-Mar-1925 Today's Date: 01/26/2015   History of Present Illness  81 y.o. male with a past medical history of status post aortic stenosis repair earlier this year, hyperlipidemia, BPH who was brought to the emergency department due to abdominal pain, nausea, several episodes of emesis and diarrhea. EMS witnessed an episode of hematemesis on the way to the hospital. He was also hypoxic and required nonrebreather mask to raise his oxygen to normal levels. In the ER, he was noticed to be on atrial fibrillation, which he has no PMH of.  Clinical Impression  Patient in bed, agreeable to participate in PT today. He is very disoriented and confused, but pleasant and conversable. Patient was able to ambulate and transfer as described below. Further ambulation was deferred due to confusion. Patient will benefit from continued PT while in the hospital to increase ambulation and independence with transfers as patient's AMS improves for safety.    Follow Up Recommendations SNF;Supervision/Assistance - 24 hour    Equipment Recommendations  Other (comment) (TBA)    Recommendations for Other Services       Precautions / Restrictions Precautions Precautions: Fall Precaution Comments: AMS Restrictions Weight Bearing Restrictions: No      Mobility  Bed Mobility Overal bed mobility: Modified Independent             General bed mobility comments: Used bed rails easily when he was initially trying to get up to use the restroom (has condom cath on). Requires increased time as well. Able to pull himself up in bed without assistance.  Transfers Overall transfer level: Needs assistance Equipment used: Rolling walker (2 wheeled);2 person hand held assist Transfers: Sit to/from Stand Sit to Stand: Mod assist;+2 physical assistance         General transfer comment: Required mod A x2 for sit to stand. Cues to stand  upright.  Ambulation/Gait Ambulation/Gait assistance: Min assist Ambulation Distance (Feet): 15 Feet Assistive device: Rolling walker (2 wheeled);1 person hand held assist Gait Pattern/deviations: Shuffle;Trunk flexed;Narrow base of support;Decreased stride length Gait velocity: Decreased Gait velocity interpretation: <1.8 ft/sec, indicative of risk for recurrent falls General Gait Details: Very shuffling gait. Repeatedly asked if we were "going to the living room" during gait. Fatigued quickly. O2 sats hovered around 88-90% when pulse ox was getting clean reading. Patient fatigued at end of walk - walked around bed and up.  Stairs            Wheelchair Mobility    Modified Rankin (Stroke Patients Only)       Balance Overall balance assessment: Needs assistance Sitting-balance support: Feet supported;No upper extremity supported Sitting balance-[name]: Good     Standing balance support: Bilateral upper extremity supported Standing balance-[name]: Fair Standing balance comment: Able to stand with use of RW for pericare.                             Pertinent Vitals/Pain Pain Assessment: No/denies pain    Home Living Family/patient expects to be discharged to:: Private residence Living Arrangements: Children Available Help at Discharge: Family;Available PRN/intermittently Type of Home: House           Additional Comments: Patient is confused and therefore not an appropriate historian. He is repeatedly confused about being in the hospital, thinks he is at home, and asked multiple times to "go to the living room." He is not agitated, just  very confused. Per other notes, he lives with his daughter who works full time.    Prior Function                 Hand Dominance   Dominant Hand: Right    Extremity/Trunk Assessment   Upper Extremity Assessment: Generalized weakness           Lower Extremity Assessment: Generalized weakness          Communication   Communication: No difficulties  Cognition Arousal/Alertness: Awake/alert Behavior During Therapy: Impulsive Overall Cognitive Status: No family/caregiver present to determine baseline cognitive functioning Area of Impairment: Orientation;Following commands;Safety/judgement Orientation Level: Disoriented to;Place;Time;Situation     Following Commands: Follows one step commands consistently;Follows one step commands with increased time Safety/Judgement: Decreased awareness of deficits     General Comments: Patient was initially very impulsive but able to follow safety commands easily and with minimal reinforcement. He is very disoriented. He was agreeable to use of RW after logic of safe use was presented to him.    General Comments      Exercises        Assessment/Plan    PT Assessment Patient needs continued PT services  PT Diagnosis Difficulty walking;Abnormality of gait;Generalized weakness;Altered mental status   PT Problem List Decreased strength;Decreased range of motion;Decreased activity tolerance;Decreased balance;Decreased mobility;Decreased cognition;Decreased knowledge of use of DME  PT Treatment Interventions DME instruction;Gait training;Functional mobility training;Therapeutic activities;Therapeutic exercise;Balance training;Neuromuscular re-education;Cognitive remediation;Patient/family education   PT Goals (Current goals can be found in the Care Plan section) Acute Rehab PT Goals Patient Stated Goal: Unable to state PT Goal Formulation: Patient unable to participate in goal setting Time For Goal Achievement: 02/09/15 Potential to Achieve Goals: Fair    Frequency Min 3X/week   Barriers to discharge Decreased caregiver support Daughter works, per CSW note she is unable to provide level of assistance patient currently needs.    Co-evaluation               End of Session   Activity Tolerance: Patient limited by fatigue;Other  (comment) (Limited by disorientation and confusion.) Patient left: in bed;with call bell/phone within reach;with bed alarm set;with SCD's reapplied Nurse Communication: Mobility status         Time: 9604-5409 PT Time Calculation (min) (ACUTE ONLY): 23 min   Charges:   PT Evaluation $Initial PT Evaluation Tier I: 1 Procedure PT Treatments $Therapeutic Activity: 8-22 mins   PT G CodesMichele Rockers, SPT [phone] 01/26/2015, 1:25 PM  I have read, reviewed and agree with student's note.   [facility] Acute Rehabilitation [phone] (pager)

## 2015-01-26 NOTE — Progress Notes (Signed)
Speech Language Pathology Treatment: Dysphagia  Patient Details Name: Max Martinez MRN: [id number] DOB: [birth year] Today's Date: 01/26/2015 Time: 6644-0347 SLP Time Calculation (min) (ACUTE ONLY): 26 min  Assessment / Plan / Recommendation Clinical Impression  SLP provided skilled observation as pt finished his breakfast meal. Of note, diet order reflected in the chart and on the meal ticket indicated the correct Dys 1 diet and nectar thick liquids, however items on pt's tray included primarily Dys 3 textures and thin liquids. Despite Max multimodal cueing, pt is still not entirely compliant with recommended strategies, and had frequent coughing with advanced trials. Overt signs of aspiration appeared to be reduced with pureed consistencies when pt was utilizing a second swallow. SLP reviewed Dys 1 diet and nectar thick liquids and need for accurate meals with food ambassador, who then assisted pt in selecting a lunch and dinner meal that included only pureed foods and nectar thick liquids. Pt is not yet ready to advance. Will continue to follow closely for tolerance of recommended diet - for now, lungs remain clear and he remains afebrile.   HPI Other Pertinent Information: Max Martinez is a 81 y.o. male with a past medical history of status post aortic stenosis repair earlier this year, hyperlipidemia, BPH who was brought to the emergency department due to abdominal pain, nausea, several episodes of emesis and diarrhea. Just 3 days before, his grandson had similar symptoms that lasted less than 24 hours and were self-limited. Per daughter, there seemed to be blood in one of the emesis episodes and EMS witnessed an episode of hematemesis on the way to the hospital. He was also hypoxic and required nonrebreather mask to raise his oxygen to normal levels. His daughter states, that prior to the emesis episodes he was breathing normally. In the ER, he was noticed to be on atrial fibrillation, which the  daughter is not aware to be a chronic problem. He had aortic valve replacement surgery about 6 months ago. To the patient's daughter's knowledge, he does not have any other cardiac history. He is currently in no acute distress, but is requiring high FiO2 to maintain oxygen saturations.   Pertinent Vitals Pain Assessment: No/denies pain  SLP Plan  Continue with current plan of care    Recommendations Diet recommendations: Dysphagia 1 (puree);Nectar-thick liquid Liquids provided via: Cup;No straw Medication Administration: Crushed with puree Supervision: Patient able to self feed;Full supervision/cueing for compensatory strategies Compensations: Slow rate;Small sips/bites;Check for pocketing;Multiple dry swallows after each bite/sip;Follow solids with liquid Postural Changes and/or Swallow Maneuvers: Seated upright 90 degrees       Oral Care Recommendations: Oral care BID;Other (Comment) (oral care after POs) Follow up Recommendations: Skilled Nursing facility Plan: Continue with current plan of care    Maxcine Ham, M.A. CCC-SLP [phone]  Maxcine Ham 01/26/2015, 11:41 AM

## 2015-01-26 NOTE — Progress Notes (Signed)
ANTIBIOTIC CONSULT NOTE - FOLLOW UP  Pharmacy Consult:  Vancomycin / Zosyn ->Levaquin Indication:  PNA  No Known Allergies  Patient Measurements: Height:  (180.3 cm) Weight: 154 lb 8.7 oz (70.1 kg) IBW/kg (Calculated) : 75.3  Vital Signs: Temp: 97.8 F (36.6 C) (09/29 0750) Temp Source: Oral (09/29 0750) BP: 167/70 mmHg (09/29 0912) Pulse Rate: 61 (09/29 0912) Intake/Output from previous day: 09/28 0701 - 09/29 0700 In: 1690 [P.O.:240; I.V.:1150; IV Piggyback:300] Out: 1000 [Urine:1000] Intake/Output from this shift: Total I/O In: 240 [P.O.:240] Out: 200 [Urine:200]  Labs:  Recent Labs  01/24/15 0952 01/25/15 0318  WBC 9.3 9.5  HGB 12.7* 12.3*  PLT 119* 114*  CREATININE 1.07 1.07   Estimated Creatinine Clearance: 47.3 mL/min (by C-G formula based on Cr of 1.07).  Recent Labs  01/26/15 0322  VANCOTROUGH 8*     Microbiology: Recent Results (from the past 720 hour(s))  MRSA PCR Screening     Status: None   Collection Time: 01/22/15  1:00 AM  Result Value Ref Range Status   MRSA by PCR NEGATIVE NEGATIVE Final    Comment:        The GeneXpert MRSA Assay (FDA approved for NASAL specimens only), is one component of a comprehensive MRSA colonization surveillance program. It is not intended to diagnose MRSA infection nor to guide or monitor treatment for MRSA infections.   Culture, blood (routine x 2) Call MD if unable to obtain prior to antibiotics being given     Status: None (Preliminary result)   Collection Time: 01/22/15  2:32 AM  Result Value Ref Range Status   Specimen Description BLOOD RIGHT HAND  Final   Special Requests BOTTLES DRAWN AEROBIC ONLY 5CC  Final   Culture NO GROWTH 3 DAYS  Final   Report Status PENDING  Incomplete  Culture, blood (routine x 2) Call MD if unable to obtain prior to antibiotics being given     Status: None (Preliminary result)   Collection Time: 01/22/15  2:36 AM  Result Value Ref Range Status   Specimen  Description BLOOD LEFT HAND  Final   Special Requests IN PEDIATRIC BOTTLE 1CC  Final   Culture NO GROWTH 3 DAYS  Final   Report Status PENDING  Incomplete  C difficile quick scan w PCR reflex     Status: None   Collection Time: 01/22/15  1:51 PM  Result Value Ref Range Status   C Diff antigen NEGATIVE NEGATIVE Final   C Diff toxin NEGATIVE NEGATIVE Final   C Diff interpretation Negative for toxigenic C. difficile  Final  Stool culture     Status: None   Collection Time: 01/22/15  1:52 PM  Result Value Ref Range Status   Specimen Description STOOL  Final   Special Requests NONE  Final   Culture   Final    NO SALMONELLA, SHIGELLA, CAMPYLOBACTER, YERSINIA, OR E.COLI 0157:H7 ISOLATED Performed at [facility]    Report Status 01/26/2015 FINAL  Final      Assessment: Max Martinez with PNA to narrow abx to Levaquin today (Total abx Day #5).  Afebrile, WBC WNL. Patient's renal function stable.  Levaquin 9/29> Vanc 9/25 >>9/29 Zosyn 9/25 >>9/29  Goal of Therapy:  Resolution of infection   Plan:  - Levaquin  po q48h - Will f/u renal function and LOT - F/U resume home meds, change PPI to PO (defer to MD given GIB)  Christoper Fabian, PharmD, BCPS Clinical pharmacist, pager [phone] 01/26/2015, 11:56 AM

## 2015-01-27 LAB — CBC
HEMATOCRIT: 39.6 % (ref 39.0–52.0)
HEMOGLOBIN: 13.8 g/dL (ref 13.0–17.0)
MCH: 31.2 pg (ref 26.0–34.0)
MCHC: 34.8 g/dL (ref 30.0–36.0)
MCV: 89.6 fL (ref 78.0–100.0)
Platelets: UNDETERMINED 10*3/uL (ref 150–400)
RBC: 4.42 MIL/uL (ref 4.22–5.81)
RDW: 14.1 % (ref 11.5–15.5)
WBC: 11 10*3/uL — AB (ref 4.0–10.5)

## 2015-01-27 LAB — CULTURE, BLOOD (ROUTINE X 2)
Culture: NO GROWTH
Culture: NO GROWTH

## 2015-01-27 LAB — GLUCOSE, CAPILLARY
GLUCOSE-CAPILLARY: 117 mg/dL — AB (ref 65–99)
Glucose-Capillary: 113 mg/dL — ABNORMAL HIGH (ref 65–99)

## 2015-01-27 MED ORDER — PANTOPRAZOLE SODIUM 40 MG PO TBEC: 40.0000 mg | DELAYED_RELEASE_TABLET | Freq: Two times a day (BID) | ORAL | Status: DC

## 2015-01-27 MED ORDER — BENZONATATE 100 MG PO CAPS: 100.0000 mg | ORAL_CAPSULE | Freq: Three times a day (TID) | ORAL | Status: DC | PRN

## 2015-01-27 MED ORDER — LEVOFLOXACIN 750 MG PO TABS: 750.0000 mg | ORAL_TABLET | ORAL | Status: DC

## 2015-01-27 MED ORDER — METOPROLOL TARTRATE 25 MG PO TABS: 25.0000 mg | ORAL_TABLET | Freq: Two times a day (BID) | ORAL | Status: DC

## 2015-01-27 MED ORDER — PANTOPRAZOLE SODIUM 40 MG PO TBEC
40.0000 mg | DELAYED_RELEASE_TABLET | Freq: Two times a day (BID) | ORAL | Status: DC
  Administered 2015-01-27: 40 mg via ORAL
  Filled 2015-01-27: qty 1

## 2015-01-27 MED ORDER — LOPERAMIDE HCL 2 MG PO TABS
2.0000 mg | ORAL_TABLET | Freq: Four times a day (QID) | ORAL | Status: DC | PRN
Start: 2015-01-27 — End: 2016-07-11

## 2015-01-27 NOTE — Clinical Social Work Placement (Signed)
   CLINICAL SOCIAL WORK PLACEMENT  NOTE  Date:  01/27/2015  Patient Details  Name: Max Martinez MRN: [id number] Date of Birth: 1924-07-18  Clinical Social Work is seeking post-discharge placement for this patient at the Skilled  Nursing Facility level of care (*CSW will initial, date and re-position this form in  chart as items are completed):  Yes   Patient/family provided with [facility] Clinical Social Work Department's list of facilities offering this level of care within the geographic area requested by the patient (or if unable, by the patient's family).  Yes   Patient/family informed of their freedom to choose among providers that offer the needed level of care, that participate in Medicare, Medicaid or managed care program needed by the patient, have an available bed and are willing to accept the patient.  Yes   Patient/family informed of [facility]'s ownership interest in [facility] and [facility], as well as of the fact that they are under no obligation to receive care at these facilities.  PASRR submitted to EDS on 01/26/15     PASRR number received on 01/26/15     Existing PASRR number confirmed on       FL2 transmitted to all facilities in geographic area requested by pt/family on 01/26/15     FL2 transmitted to all facilities within larger geographic area on       Patient informed that his/her managed care company has contracts with or will negotiate with certain facilities, including the following:        Yes   Patient/family informed of bed offers received.  Patient chooses bed at [facility]     Physician recommends and patient chooses bed at      Patient to be transferred to [facility] on 01/27/15.  Patient to be transferred to facility by Ambulance     Patient family notified on 01/27/15 of transfer.  Name of family member notified:  [name]     PHYSICIAN Please sign FL2, Please sign DNR     Additional Comment:  Per MD patient ready for  DC to [facility]. RN, patient, patient's family, and facility notified of DC. RN given number for report. DC packet on chart. Ambulance transport requested for patient. CSW signing off.    _______________________________________________ Roddie Mc MSW, LCSW, [place], [phone]

## 2015-01-27 NOTE — Progress Notes (Signed)
Speech Language Pathology Treatment: Dysphagia  Patient Details Name: Max Martinez MRN: [id number] DOB: 1924-05-18 Today's Date: 01/27/2015 Time: 1040-1100 SLP Time Calculation (min) (ACUTE ONLY): 20 min  Assessment / Plan / Recommendation Clinical Impression  Pt coughing prior to po intake when SLP arrived; given medication crushed in puree when SLP began session with nursing present to administer medications; he exhibited a delayed cough with meds in puree and intermittent coughing/wet vocal quality and oral expulsion with nectar-thickened liquids/puree consistency; immediate cough noted x2 during session as well; expiratory wheezing noted despite maximal cues to swallow multiple times after coughing/po intake; ST will con't to follow with Dysphagia 1/nectar-thickened liquids recommended d/t risk of aspiration with residuals without swallowing precautions; maximal cues needed to reduce pharyngeal residue amount per skilled observation of s/s and prior results of MBS.   HPI Other Pertinent Information: Max Martinez is a 81 y.o. male with a past medical history of status post aortic stenosis repair earlier this year, hyperlipidemia, BPH who was brought to the emergency department due to abdominal pain, nausea, several episodes of emesis and diarrhea. Just 3 days before, his grandson had similar symptoms that lasted less than 24 hours and were self-limited. Per daughter, there seemed to be blood in one of the emesis episodes and EMS witnessed an episode of hematemesis on the way to the hospital. He was also hypoxic and required nonrebreather mask to raise his oxygen to normal levels. His daughter states, that prior to the emesis episodes he was breathing normally. In the ER, he was noticed to be on atrial fibrillation, which the daughter is not aware to be a chronic problem. He had aortic valve replacement surgery about 6 months ago. To the patient's daughter's knowledge, he does not have any other  cardiac history. He is currently in no acute distress, but is requiring high FiO2 to maintain oxygen saturations.   Pertinent Vitals Pain Assessment: No/denies pain  SLP Plan  Continue with current plan of care    Recommendations Diet recommendations: Dysphagia 1 (puree);Nectar-thick liquid Liquids provided via: Cup;No straw Medication Administration: Crushed with puree Supervision: Patient able to self feed Compensations: Slow rate;Small sips/bites;Effortful swallow;Other (Comment) (multiple swallows) Postural Changes and/or Swallow Maneuvers: Seated upright 90 degrees              Oral Care Recommendations: Oral care BID Follow up Recommendations: Skilled Nursing facility Plan: Continue with current plan of care         ADAMS,PAT, M.S., CCC-SLP 01/27/2015, 11:11 AM

## 2015-01-27 NOTE — Progress Notes (Signed)
Patient discharged to [facility] via EMS transport and daughter at bedside to follow. IV discontinued. Report called to Dunn Loring at [facility]. Discharge instructions provided in packet per [facility]. Patient stable without complaints.
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
## 2015-01-27 NOTE — Care Management Note (Signed)
Case Management Note  Patient Details  Name: ARVIN ABELLO MRN: [id number] Date of Birth: October 03, 1924  Subjective/Objective:  Patient dc to snf today.                  Action/Plan:   Expected Discharge Date:                  Expected Discharge Plan:  Skilled Nursing Facility  In-House Referral:  Clinical Social Work  Discharge planning Services  CM Consult  Post Acute Care Choice:    Choice offered to:     DME Arranged:    DME Agency:     HH Arranged:    HH Agency:     Status of Service:  Completed, signed off  Medicare Important Message Given:  Yes-second notification given Date Medicare IM Given:    Medicare IM give by:    Date Additional Medicare IM Given:    Additional Medicare Important Message give by:     If discussed at Long Length of Stay Meetings, dates discussed:    Additional Comments:  Leone Haven, RN 01/27/2015, 5:15 PM

## 2015-01-27 NOTE — Progress Notes (Signed)
Report called to Stephanie at [facility].   

## 2015-01-31 ENCOUNTER — Non-Acute Institutional Stay (SKILLED_NURSING_FACILITY): Payer: Medicare Other | Admitting: Internal Medicine

## 2015-01-31 DIAGNOSIS — F0390 Unspecified dementia without behavioral disturbance: Secondary | ICD-10-CM | POA: Diagnosis not present

## 2015-01-31 DIAGNOSIS — K219 Gastro-esophageal reflux disease without esophagitis: Secondary | ICD-10-CM | POA: Diagnosis not present

## 2015-01-31 DIAGNOSIS — J690 Pneumonitis due to inhalation of food and vomit: Secondary | ICD-10-CM

## 2015-01-31 DIAGNOSIS — R5381 Other malaise: Secondary | ICD-10-CM | POA: Diagnosis not present

## 2015-01-31 DIAGNOSIS — G4710 Hypersomnia, unspecified: Secondary | ICD-10-CM

## 2015-01-31 DIAGNOSIS — F03C0 Unspecified dementia, severe, without behavioral disturbance, psychotic disturbance, mood disturbance, and anxiety: Secondary | ICD-10-CM

## 2015-01-31 DIAGNOSIS — I482 Chronic atrial fibrillation, unspecified: Secondary | ICD-10-CM

## 2015-01-31 DIAGNOSIS — N400 Enlarged prostate without lower urinary tract symptoms: Secondary | ICD-10-CM | POA: Diagnosis not present

## 2015-01-31 DIAGNOSIS — D72829 Elevated white blood cell count, unspecified: Secondary | ICD-10-CM
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
## 2015-02-01 LAB — BASIC METABOLIC PANEL
BUN: 15 mg/dL (ref 4–21)
CREATININE: 1.1 mg/dL (ref 0.6–1.3)
Glucose: 100 mg/dL
POTASSIUM: 3.5 mmol/L (ref 3.4–5.3)
Sodium: 143 mmol/L (ref 137–147)

## 2015-02-01 LAB — CBC AND DIFFERENTIAL
HCT: 39 % — AB (ref 41–53)
Hemoglobin: 12.3 g/dL — AB (ref 13.5–17.5)
PLATELETS: 288 10*3/uL (ref 150–399)
WBC: 10 10^3/mL

## 2015-02-13 ENCOUNTER — Non-Acute Institutional Stay (SKILLED_NURSING_FACILITY): Payer: Medicare Other | Admitting: Nurse Practitioner

## 2015-02-13 DIAGNOSIS — K219 Gastro-esophageal reflux disease without esophagitis: Secondary | ICD-10-CM

## 2015-02-13 DIAGNOSIS — F0390 Unspecified dementia without behavioral disturbance: Secondary | ICD-10-CM

## 2015-02-13 DIAGNOSIS — N400 Enlarged prostate without lower urinary tract symptoms: Secondary | ICD-10-CM

## 2015-02-13 DIAGNOSIS — J690 Pneumonitis due to inhalation of food and vomit: Secondary | ICD-10-CM

## 2015-02-13 DIAGNOSIS — R5381 Other malaise: Secondary | ICD-10-CM

## 2015-02-13 DIAGNOSIS — G4710 Hypersomnia, unspecified: Secondary | ICD-10-CM

## 2015-02-13 DIAGNOSIS — F03C0 Unspecified dementia, severe, without behavioral disturbance, psychotic disturbance, mood disturbance, and anxiety: Secondary | ICD-10-CM
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
## 2015-03-13 ENCOUNTER — Other Ambulatory Visit: Payer: Self-pay | Admitting: Nurse Practitioner

## 2015-05-02 ENCOUNTER — Other Ambulatory Visit: Payer: Self-pay | Admitting: Nurse Practitioner

## 2015-06-08 ENCOUNTER — Other Ambulatory Visit: Payer: Self-pay | Admitting: Nurse Practitioner

## 2015-07-18 ENCOUNTER — Other Ambulatory Visit: Payer: Self-pay | Admitting: Nurse Practitioner

## 2015-09-13 ENCOUNTER — Other Ambulatory Visit: Payer: Self-pay | Admitting: Nurse Practitioner

## 2016-07-10 ENCOUNTER — Encounter (HOSPITAL_COMMUNITY): Payer: Self-pay | Admitting: Pharmacy Technician

## 2016-07-10 ENCOUNTER — Inpatient Hospital Stay (HOSPITAL_COMMUNITY)
Admission: EM | Admit: 2016-07-10 | Discharge: 2016-07-17 | DRG: 871 | Disposition: A | Discharge status: Skilled Nursing Facility | Payer: Medicare HMO | Attending: Family Medicine | Admitting: Family Medicine

## 2016-07-10 ENCOUNTER — Other Ambulatory Visit: Payer: Self-pay

## 2016-07-10 ENCOUNTER — Emergency Department (HOSPITAL_COMMUNITY): Payer: Medicare HMO

## 2016-07-10 DIAGNOSIS — G301 Alzheimer's disease with late onset: Secondary | ICD-10-CM

## 2016-07-10 DIAGNOSIS — A4151 Sepsis due to Escherichia coli [E. coli]: Principal | ICD-10-CM | POA: Diagnosis present

## 2016-07-10 DIAGNOSIS — I5042 Chronic combined systolic (congestive) and diastolic (congestive) heart failure: Secondary | ICD-10-CM | POA: Diagnosis present

## 2016-07-10 DIAGNOSIS — A419 Sepsis, unspecified organism: Secondary | ICD-10-CM | POA: Diagnosis not present

## 2016-07-10 DIAGNOSIS — Z8701 Personal history of pneumonia (recurrent): Secondary | ICD-10-CM

## 2016-07-10 DIAGNOSIS — R0602 Shortness of breath: Secondary | ICD-10-CM | POA: Diagnosis not present

## 2016-07-10 DIAGNOSIS — R509 Fever, unspecified: Secondary | ICD-10-CM | POA: Diagnosis present

## 2016-07-10 DIAGNOSIS — I110 Hypertensive heart disease with heart failure: Secondary | ICD-10-CM | POA: Diagnosis present

## 2016-07-10 DIAGNOSIS — Z96652 Presence of left artificial knee joint: Secondary | ICD-10-CM | POA: Diagnosis present

## 2016-07-10 DIAGNOSIS — J9601 Acute respiratory failure with hypoxia: Secondary | ICD-10-CM | POA: Diagnosis present

## 2016-07-10 DIAGNOSIS — D6959 Other secondary thrombocytopenia: Secondary | ICD-10-CM | POA: Diagnosis present

## 2016-07-10 DIAGNOSIS — I350 Nonrheumatic aortic (valve) stenosis: Secondary | ICD-10-CM

## 2016-07-10 DIAGNOSIS — J189 Pneumonia, unspecified organism: Secondary | ICD-10-CM | POA: Diagnosis present

## 2016-07-10 DIAGNOSIS — F0280 Dementia in other diseases classified elsewhere without behavioral disturbance: Secondary | ICD-10-CM | POA: Diagnosis present

## 2016-07-10 DIAGNOSIS — J181 Lobar pneumonia, unspecified organism: Secondary | ICD-10-CM | POA: Diagnosis not present

## 2016-07-10 DIAGNOSIS — D696 Thrombocytopenia, unspecified: Secondary | ICD-10-CM | POA: Diagnosis not present

## 2016-07-10 DIAGNOSIS — E785 Hyperlipidemia, unspecified: Secondary | ICD-10-CM | POA: Diagnosis present

## 2016-07-10 DIAGNOSIS — Z823 Family history of stroke: Secondary | ICD-10-CM | POA: Diagnosis not present

## 2016-07-10 DIAGNOSIS — R32 Unspecified urinary incontinence: Secondary | ICD-10-CM | POA: Diagnosis present

## 2016-07-10 DIAGNOSIS — Z66 Do not resuscitate: Secondary | ICD-10-CM | POA: Diagnosis present

## 2016-07-10 DIAGNOSIS — I724 Aneurysm of artery of lower extremity: Secondary | ICD-10-CM | POA: Diagnosis present

## 2016-07-10 DIAGNOSIS — I2583 Coronary atherosclerosis due to lipid rich plaque: Secondary | ICD-10-CM | POA: Diagnosis present

## 2016-07-10 DIAGNOSIS — Z952 Presence of prosthetic heart valve: Secondary | ICD-10-CM | POA: Diagnosis not present

## 2016-07-10 DIAGNOSIS — I5022 Chronic systolic (congestive) heart failure: Secondary | ICD-10-CM | POA: Diagnosis present

## 2016-07-10 DIAGNOSIS — M79609 Pain in unspecified limb: Secondary | ICD-10-CM | POA: Diagnosis not present

## 2016-07-10 DIAGNOSIS — G309 Alzheimer's disease, unspecified: Secondary | ICD-10-CM | POA: Diagnosis present

## 2016-07-10 DIAGNOSIS — N390 Urinary tract infection, site not specified: Secondary | ICD-10-CM | POA: Diagnosis present

## 2016-07-10 DIAGNOSIS — Z7982 Long term (current) use of aspirin: Secondary | ICD-10-CM | POA: Diagnosis not present

## 2016-07-10 DIAGNOSIS — I2510 Atherosclerotic heart disease of native coronary artery without angina pectoris: Secondary | ICD-10-CM | POA: Diagnosis not present

## 2016-07-10 DIAGNOSIS — I10 Essential (primary) hypertension: Secondary | ICD-10-CM | POA: Diagnosis present

## 2016-07-10 DIAGNOSIS — I480 Paroxysmal atrial fibrillation: Secondary | ICD-10-CM | POA: Diagnosis present

## 2016-07-10 DIAGNOSIS — E861 Hypovolemia: Secondary | ICD-10-CM | POA: Diagnosis present

## 2016-07-10 HISTORY — DX: Heart failure, unspecified: I50.9

## 2016-07-10 HISTORY — DX: Essential (primary) hypertension: I10

## 2016-07-10 HISTORY — DX: Peripheral vascular disease, unspecified: I73.9

## 2016-07-10 HISTORY — DX: Atherosclerotic heart disease of native coronary artery without angina pectoris: I25.10

## 2016-07-10 LAB — COMPREHENSIVE METABOLIC PANEL
ALK PHOS: 53 U/L (ref 38–126)
ALT: 22 U/L (ref 17–63)
ANION GAP: 16 — AB (ref 5–15)
AST: 41 U/L (ref 15–41)
Albumin: 3.6 g/dL (ref 3.5–5.0)
BILIRUBIN TOTAL: 0.9 mg/dL (ref 0.3–1.2)
BUN: 18 mg/dL (ref 6–20)
CALCIUM: 8.5 mg/dL — AB (ref 8.9–10.3)
CO2: 21 mmol/L — ABNORMAL LOW (ref 22–32)
Chloride: 104 mmol/L (ref 101–111)
Creatinine, Ser: 1.19 mg/dL (ref 0.61–1.24)
GFR calc non Af Amer: 52 mL/min — ABNORMAL LOW (ref >60)
Glucose, Bld: 99 mg/dL (ref 65–99)
Potassium: 3.8 mmol/L (ref 3.5–5.1)
Sodium: 141 mmol/L (ref 135–145)
TOTAL PROTEIN: 6.6 g/dL (ref 6.5–8.1)

## 2016-07-10 LAB — CBC WITH DIFFERENTIAL/PLATELET
BASOS ABS: 0 10*3/uL (ref 0.0–0.1)
Basophils Relative: 0 %
EOS ABS: 0 10*3/uL (ref 0.0–0.7)
Eosinophils Relative: 0 %
HCT: 41.9 % (ref 39.0–52.0)
Hemoglobin: 13.9 g/dL (ref 13.0–17.0)
LYMPHS ABS: 0.3 10*3/uL — AB (ref 0.7–4.0)
Lymphocytes Relative: 6 %
MCH: 31.7 pg (ref 26.0–34.0)
MCHC: 33.2 g/dL (ref 30.0–36.0)
MCV: 95.7 fL (ref 78.0–100.0)
Monocytes Absolute: 0 10*3/uL — ABNORMAL LOW (ref 0.1–1.0)
Monocytes Relative: 1 %
Neutro Abs: 4.1 10*3/uL (ref 1.7–7.7)
Neutrophils Relative %: 93 %
PLATELETS: 93 10*3/uL — AB (ref 150–400)
RBC: 4.38 MIL/uL (ref 4.22–5.81)
RDW: 14.4 % (ref 11.5–15.5)
WBC: 4.4 10*3/uL (ref 4.0–10.5)

## 2016-07-10 LAB — I-STAT CG4 LACTIC ACID, ED
LACTIC ACID, VENOUS: 2.49 mmol/L — AB (ref 0.5–1.9)
Lactic Acid, Venous: 3.72 mmol/L (ref 0.5–1.9)

## 2016-07-10 LAB — I-STAT TROPONIN, ED: Troponin i, poc: 0.05 ng/mL (ref 0.00–0.08)

## 2016-07-10 MED ORDER — SODIUM CHLORIDE 0.9 % IV BOLUS (SEPSIS)
1000.0000 mL | Freq: Once | INTRAVENOUS | Status: AC
  Administered 2016-07-11: 1000 mL via INTRAVENOUS

## 2016-07-10 MED ORDER — SODIUM CHLORIDE 0.9 % IV BOLUS (SEPSIS)
1000.0000 mL | Freq: Once | INTRAVENOUS | Status: AC
  Administered 2016-07-10: 1000 mL via INTRAVENOUS

## 2016-07-10 MED ORDER — SODIUM CHLORIDE 0.9 % IV BOLUS (SEPSIS): 1000.0000 mL | Freq: Once | INTRAVENOUS | Status: DC

## 2016-07-10 MED ORDER — DEXTROSE 5 % IV SOLN
500.0000 mg | Freq: Once | INTRAVENOUS | Status: AC
  Administered 2016-07-10: 500 mg via INTRAVENOUS
  Filled 2016-07-10: qty 500

## 2016-07-10 MED ORDER — ACETAMINOPHEN 325 MG PO TABS
650.0000 mg | ORAL_TABLET | Freq: Once | ORAL | Status: AC
  Administered 2016-07-10: 650 mg via ORAL
  Filled 2016-07-10: qty 2

## 2016-07-10 MED ORDER — DEXTROSE 5 % IV SOLN
1.0000 g | Freq: Once | INTRAVENOUS | Status: AC
  Administered 2016-07-10: 1 g via INTRAVENOUS
  Filled 2016-07-10: qty 10

## 2016-07-10 NOTE — ED Notes (Signed)
Spoke with MD in regards to limited iv access. MD verified use of IV of the foot for this RN.
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
## 2016-07-10 NOTE — ED Triage Notes (Signed)
Pt from home via [facility] with reports of V/D and incontinent. Pt with hx of dementia and per ems pt is at baseline. Pt is febrile upon arrival to ED. Per EMS pt oxygen saturation on RA was 78 so pt placed on NRB and now saturations 100%. Pt given 4mg  zofran prior to arrival.

## 2016-07-10 NOTE — ED Notes (Signed)
Pt daughter Nelva BushLisa Welker, [phone], to be called with updates.

## 2016-07-10 NOTE — ED Notes (Signed)
Pericare was done on pt. Pt had large bm and large void in diaper. Pt changed with new diaper, new pads, sheets, and gown. StatisticianCrystal RN, assist.

## 2016-07-10 NOTE — ED Notes (Signed)
Pt noted to have saturations on RA at 81%. This RN placed pt on 4L [place]. Oxygen saturations improved to 98%/ Will cont to monitor,

## 2016-07-10 NOTE — ED Notes (Signed)
Dr Rhunette CroftNanavati given a copy of lactic acid results 3.72

## 2016-07-10 NOTE — ED Notes (Signed)
Recollect blood due to clotting. 

## 2016-07-11 ENCOUNTER — Inpatient Hospital Stay (HOSPITAL_COMMUNITY): Payer: Medicare HMO

## 2016-07-11 DIAGNOSIS — J189 Pneumonia, unspecified organism: Secondary | ICD-10-CM

## 2016-07-11 DIAGNOSIS — I10 Essential (primary) hypertension: Secondary | ICD-10-CM

## 2016-07-11 DIAGNOSIS — I2510 Atherosclerotic heart disease of native coronary artery without angina pectoris: Secondary | ICD-10-CM

## 2016-07-11 DIAGNOSIS — I350 Nonrheumatic aortic (valve) stenosis: Secondary | ICD-10-CM

## 2016-07-11 DIAGNOSIS — I2583 Coronary atherosclerosis due to lipid rich plaque: Secondary | ICD-10-CM

## 2016-07-11 DIAGNOSIS — M79609 Pain in unspecified limb: Secondary | ICD-10-CM

## 2016-07-11 LAB — COMPREHENSIVE METABOLIC PANEL
ALK PHOS: 39 U/L (ref 38–126)
ALT: 18 U/L (ref 17–63)
AST: 30 U/L (ref 15–41)
Albumin: 2.7 g/dL — ABNORMAL LOW (ref 3.5–5.0)
Anion gap: 11 (ref 5–15)
BILIRUBIN TOTAL: 0.7 mg/dL (ref 0.3–1.2)
BUN: 18 mg/dL (ref 6–20)
CO2: 20 mmol/L — ABNORMAL LOW (ref 22–32)
CREATININE: 1.15 mg/dL (ref 0.61–1.24)
Calcium: 7.1 mg/dL — ABNORMAL LOW (ref 8.9–10.3)
Chloride: 109 mmol/L (ref 101–111)
GFR calc Af Amer: >60 mL/min (ref >60)
GFR, EST NON AFRICAN AMERICAN: 54 mL/min — AB (ref >60)
Glucose, Bld: 117 mg/dL — ABNORMAL HIGH (ref 65–99)
Potassium: 3.2 mmol/L — ABNORMAL LOW (ref 3.5–5.1)
Sodium: 140 mmol/L (ref 135–145)
TOTAL PROTEIN: 5.6 g/dL — AB (ref 6.5–8.1)

## 2016-07-11 LAB — CBC
HEMATOCRIT: 35.6 % — AB (ref 39.0–52.0)
Hemoglobin: 11.5 g/dL — ABNORMAL LOW (ref 13.0–17.0)
MCH: 30.8 pg (ref 26.0–34.0)
MCHC: 32.3 g/dL (ref 30.0–36.0)
MCV: 95.4 fL (ref 78.0–100.0)
Platelets: 83 10*3/uL — ABNORMAL LOW (ref 150–400)
RBC: 3.73 MIL/uL — AB (ref 4.22–5.81)
RDW: 14.4 % (ref 11.5–15.5)
WBC: 9 10*3/uL (ref 4.0–10.5)

## 2016-07-11 LAB — BLOOD CULTURE ID PANEL (REFLEXED)
ACINETOBACTER BAUMANNII: NOT DETECTED
CANDIDA KRUSEI: NOT DETECTED
CANDIDA PARAPSILOSIS: NOT DETECTED
CANDIDA TROPICALIS: NOT DETECTED
CARBAPENEM RESISTANCE: NOT DETECTED
Candida albicans: NOT DETECTED
Candida glabrata: NOT DETECTED
Enterobacter cloacae complex: NOT DETECTED
Enterobacteriaceae species: DETECTED — AB
Enterococcus species: NOT DETECTED
Escherichia coli: DETECTED — AB
Haemophilus influenzae: NOT DETECTED
KLEBSIELLA PNEUMONIAE: NOT DETECTED
Klebsiella oxytoca: NOT DETECTED
Listeria monocytogenes: NOT DETECTED
Neisseria meningitidis: NOT DETECTED
PROTEUS SPECIES: NOT DETECTED
Pseudomonas aeruginosa: NOT DETECTED
STREPTOCOCCUS SPECIES: NOT DETECTED
Serratia marcescens: NOT DETECTED
Staphylococcus aureus (BCID): NOT DETECTED
Staphylococcus species: NOT DETECTED
Streptococcus agalactiae: NOT DETECTED
Streptococcus pneumoniae: NOT DETECTED
Streptococcus pyogenes: NOT DETECTED

## 2016-07-11 LAB — GLUCOSE, CAPILLARY: Glucose-Capillary: 108 mg/dL — ABNORMAL HIGH (ref 65–99)

## 2016-07-11 LAB — INFLUENZA PANEL BY PCR (TYPE A & B)
INFLBPCR: NEGATIVE
Influenza A By PCR: NEGATIVE

## 2016-07-11 LAB — LACTIC ACID, PLASMA
Lactic Acid, Venous: 1.8 mmol/L (ref 0.5–1.9)
Lactic Acid, Venous: 1.9 mmol/L (ref 0.5–1.9)

## 2016-07-11 LAB — MRSA PCR SCREENING: MRSA BY PCR: NEGATIVE

## 2016-07-11 MED ORDER — DEXTROSE 5 % IV SOLN
500.0000 mg | INTRAVENOUS | Status: DC
  Administered 2016-07-11 – 2016-07-13 (×2): 500 mg via INTRAVENOUS
  Filled 2016-07-11 (×3): qty 500

## 2016-07-11 MED ORDER — POTASSIUM CHLORIDE CRYS ER 20 MEQ PO TBCR
40.0000 meq | EXTENDED_RELEASE_TABLET | Freq: Two times a day (BID) | ORAL | Status: AC
  Administered 2016-07-11 (×2): 40 meq via ORAL
  Filled 2016-07-11 (×2): qty 2

## 2016-07-11 MED ORDER — SODIUM CHLORIDE 0.9 % IV SOLN
INTRAVENOUS | Status: DC
  Administered 2016-07-11 – 2016-07-12 (×4): via INTRAVENOUS

## 2016-07-11 MED ORDER — ASPIRIN EC 81 MG PO TBEC
81.0000 mg | DELAYED_RELEASE_TABLET | Freq: Every day | ORAL | Status: DC
  Administered 2016-07-11 – 2016-07-17 (×7): 81 mg via ORAL
  Filled 2016-07-11 (×7): qty 1

## 2016-07-11 MED ORDER — PRAVASTATIN SODIUM 40 MG PO TABS
40.0000 mg | ORAL_TABLET | Freq: Every day | ORAL | Status: DC
  Administered 2016-07-11 – 2016-07-17 (×7): 40 mg via ORAL
  Filled 2016-07-11 (×7): qty 1

## 2016-07-11 MED ORDER — SODIUM CHLORIDE 0.9% FLUSH
10.0000 mL | INTRAVENOUS | Status: DC | PRN
  Administered 2016-07-11 – 2016-07-13 (×2): 10 mL
  Administered 2016-07-14: 20 mL
  Administered 2016-07-14: 10 mL
  Administered 2016-07-15: 20 mL
  Administered 2016-07-17: 10 mL
  Filled 2016-07-11 (×6): qty 40

## 2016-07-11 MED ORDER — ACETAMINOPHEN 650 MG RE SUPP: 650.0000 mg | Freq: Four times a day (QID) | RECTAL | Status: DC | PRN

## 2016-07-11 MED ORDER — DONEPEZIL HCL 5 MG PO TABS
5.0000 mg | ORAL_TABLET | Freq: Every day | ORAL | Status: DC
  Administered 2016-07-11 – 2016-07-16 (×6): 5 mg via ORAL
  Filled 2016-07-11 (×6): qty 1

## 2016-07-11 MED ORDER — ALBUTEROL SULFATE (2.5 MG/3ML) 0.083% IN NEBU
2.5000 mg | INHALATION_SOLUTION | Freq: Once | RESPIRATORY_TRACT | Status: AC
  Administered 2016-07-11: 2.5 mg via RESPIRATORY_TRACT
  Filled 2016-07-11: qty 3

## 2016-07-11 MED ORDER — DEXTROSE 5 % IV SOLN
2.0000 g | INTRAVENOUS | Status: DC
  Administered 2016-07-11 – 2016-07-14 (×4): 2 g via INTRAVENOUS
  Filled 2016-07-11 (×6): qty 2

## 2016-07-11 MED ORDER — ONDANSETRON HCL 4 MG/2ML IJ SOLN: 4.0000 mg | Freq: Four times a day (QID) | INTRAMUSCULAR | Status: DC | PRN

## 2016-07-11 MED ORDER — DEXTROSE 5 % IV SOLN
1.0000 g | INTRAVENOUS | Status: DC
  Filled 2016-07-11: qty 10

## 2016-07-11 MED ORDER — ACETAMINOPHEN 325 MG PO TABS
650.0000 mg | ORAL_TABLET | Freq: Four times a day (QID) | ORAL | Status: DC | PRN
  Administered 2016-07-14 – 2016-07-16 (×2): 650 mg via ORAL
  Filled 2016-07-11 (×2): qty 2

## 2016-07-11 MED ORDER — FINASTERIDE 5 MG PO TABS
5.0000 mg | ORAL_TABLET | Freq: Every day | ORAL | Status: DC
  Administered 2016-07-11 – 2016-07-17 (×7): 5 mg via ORAL
  Filled 2016-07-11 (×7): qty 1

## 2016-07-11 MED ORDER — ONDANSETRON HCL 4 MG PO TABS: 4.0000 mg | ORAL_TABLET | Freq: Four times a day (QID) | ORAL | Status: DC | PRN

## 2016-07-11 MED ORDER — SODIUM CHLORIDE 0.9 % IV BOLUS (SEPSIS)
1000.0000 mL | Freq: Once | INTRAVENOUS | Status: AC
  Administered 2016-07-11: 1000 mL via INTRAVENOUS

## 2016-07-11 MED ORDER — TAMSULOSIN HCL 0.4 MG PO CAPS
0.4000 mg | ORAL_CAPSULE | Freq: Every day | ORAL | Status: DC
  Administered 2016-07-11 – 2016-07-17 (×7): 0.4 mg via ORAL
  Filled 2016-07-11 (×7): qty 1

## 2016-07-11 MED ORDER — SODIUM CHLORIDE 0.9% FLUSH
10.0000 mL | Freq: Two times a day (BID) | INTRAVENOUS | Status: DC
  Administered 2016-07-11 – 2016-07-16 (×5): 10 mL

## 2016-07-11 NOTE — Progress Notes (Signed)
PROGRESS NOTE    Max Martinez  NWG:[id number] DOB: Sep 27, 1924 DOA: 07/10/2016 PCP: Pcp Not In System    Brief Narrative:  83 y.o. male with a past medical history significant for dementia, HTN, CAD, AS s/p TAVR in 2016, Afib not on anticoagulation and CHF EF 45% who presents with fever, cough, vomiting for 1 day. Patient was found to have R basilar PNA with sepsis and was admitted for further work up..  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 Pneumonia with sepsis present on admission:  Suspected source pneumonia. Organism unknown.  - Patient meets criteria given tachycardia, tachypnea, fever, and evidence of organ dysfunction.   - Pt is continued on azitrhomycin with rocephin - Pt now afebrile. Not tachycardic - No leukocytosis - Follow up pan-cultures  2. Chronic systolic and diastolic CHF, in setting of AS s/p TAVR:  - Hypovolemic in setting of sepsis.  Patient no longer on furosemide, BB, ACEi. -Will continue strict I/Os, daily weights - Repeat BMET in AM  3. Hypertension and coronary secondary prevention:  -Pt noted to be hypotensive at admission -Continue aspirin, statin  4. Atrial fibrillation:  CHADS2Vasc 5, not on warfarin, presumably because of age, frailty.   - Pt no longer on metoprolol  -Will continue tele and monitor  5. Dementia:  -Continue donepezil as tolerated -Avoid sedating medicaitons if possible  6. Other medications:  -Continue Flomax and Proscar as tolerated  7. Thrombocytopenia: -Plts  slightly lower today - Suspect secondary to presenting sepsis - Repeat cbc in AM  DVT prophylaxis: SCD's Code Status: DNR Family Communication: Pt in room, family not at bedside Disposition Plan: Uncertain at this time  Consultants:     Procedures:     Antimicrobials: Anti-infectives    Start     Dose/Rate Route Frequency Ordered Stop   07/11/16 2200  azithromycin (ZITHROMAX) 500 mg in dextrose 5 % 250 mL IVPB     500 mg 250 mL/hr over 60 Minutes Intravenous Every 24 hours 07/11/16 0007     07/11/16 2000  cefTRIAXone (ROCEPHIN) 1 g in dextrose 5 % 50 mL IVPB     1 g 100 mL/hr over 30 Minutes Intravenous Every 24 hours 07/11/16 0007     07/10/16 2100  cefTRIAXone (ROCEPHIN) 1 g in dextrose 5 % 50 mL IVPB     1 g 100 mL/hr over 30 Minutes Intravenous  Once 07/10/16 2054 07/10/16 2244   07/10/16 2100  azithromycin (ZITHROMAX) 500 mg in dextrose 5 % 250 mL IVPB     500 mg 250 mL/hr over 60 Minutes Intravenous  Once 07/10/16 2054 07/10/16 2315       Subjective: Without complaints  Objective: Vitals:   07/11/16 0440 07/11/16 0600 07/11/16 0700 07/11/16 1100  BP: 108/63  108/68 132/65  Pulse: 72  63 67  Resp: 20  20 18   Temp:   99.8 F (37.7 C) 98.9 F (37.2 C)  TempSrc:   Oral Oral  SpO2: 98%  91% 98%  Weight:  70.8 kg (156 lb)  Height:  5\' 7"  (1.702 m)      Intake/Output Summary (Last 24 hours) at 07/11/16 1313 Last data filed at 07/11/16 1000  Gross per 24 hour  Intake             2130 ml  Output                0 ml  Net             2130 ml   Filed Weights   07/11/16 0600  Weight: 70.8 kg (156 lb)    Examination:  General exam: Appears calm and comfortable  Respiratory system: Clear to auscultation. Respiratory effort normal. Cardiovascular system: S1 & S2 heard,  Gastrointestinal system: Abdomen is nondistended, soft and nontender. No organomegaly or masses felt. Normal bowel sounds heard. Central nervous system: Alert and oriented. No focal  neurological deficits. Extremities: Symmetric 5 x 5 power. Skin: No rashes, lesions Psychiatry: Mildly confused. Mood & affect appropriate.   Data Reviewed: I have personally reviewed following labs and imaging studies  CBC:  Recent Labs Lab 07/10/16 2131 07/11/16 0434  WBC 4.4 9.0  NEUTROABS 4.1  --   HGB 13.9 11.5*  HCT 41.9 35.6*  MCV 95.7 95.4  PLT 93* 83*   Basic Metabolic Panel:  Recent Labs Lab 07/10/16 2057 07/11/16 0434  NA 141 140  K 3.8 3.2*  CL 104 109  CO2 21* 20*  GLUCOSE 99 117*  BUN 18 18  CREATININE 1.19 1.15  CALCIUM 8.5* 7.1*   GFR: Estimated Creatinine Clearance: 40.7 mL/min (by C-G formula based on SCr of 1.15 mg/dL). Liver Function Tests:  Recent Labs Lab 07/10/16 2057 07/11/16 0434  AST 41 30  ALT 22 18  ALKPHOS 53 39  BILITOT 0.9 0.7  PROT 6.6 5.6*  ALBUMIN 3.6 2.7*   No results for input(s): LIPASE, AMYLASE in the last 168 hours. No results for input(s): AMMONIA in the last 168 hours. Coagulation Profile: No results for input(s): INR, PROTIME in the last 168 hours. Cardiac Enzymes: No results for input(s): CKTOTAL, CKMB, CKMBINDEX, TROPONINI in the last 168 hours. BNP (last 3 results) No results for input(s): PROBNP in the last 8760 hours. HbA1C: No results for input(s): HGBA1C in the last 72 hours. CBG:  Recent Labs Lab 07/11/16 0733  GLUCAP 108*   Lipid Profile: No results for input(s): CHOL, HDL, LDLCALC, TRIG, CHOLHDL, LDLDIRECT in the last 72 hours. Thyroid Function Tests: No results for input(s): TSH, T4TOTAL, FREET4, T3FREE, THYROIDAB in the last 72 hours. Anemia Panel: No results for input(s): VITAMINB12, FOLATE, FERRITIN, TIBC, IRON, RETICCTPCT in the last 72 hours. Sepsis Labs:  Recent Labs Lab 07/10/16 2152 07/10/16 2305 07/11/16 0434 07/11/16 0712  LATICACIDVEN 3.72* 2.49* 1.8 1.9    Recent Results (from the past 240 hour(s))  Blood Culture (routine x 2)     Status: None (Preliminary result)    Collection Time: 07/10/16  9:40 PM  Result Value Ref Range Status   Specimen Description BLOOD LEFT FOOT  Final   Special Requests BOTTLES DRAWN AEROBIC ONLY 5CC  Final   Culture  Setup Time   Final    GRAM NEGATIVE RODS AEROBIC BOTTLE ONLY Organism ID to follow    Culture GRAM NEGATIVE RODS  Final   Report Status PENDING  Incomplete  MRSA PCR Screening     Status: None   Collection Time: 07/11/16  6:00 AM  Result Value Ref Range Status   MRSA by PCR NEGATIVE NEGATIVE  Final    Comment:        The GeneXpert MRSA Assay (FDA approved for NASAL specimens only), is one component of a comprehensive MRSA colonization surveillance program. It is not intended to diagnose MRSA infection nor to guide or monitor treatment for MRSA infections.      Radiology Studies: Dg Chest Port 1 View  Result Date: 07/10/2016 CLINICAL DATA:  83 year old male with fever. EXAM: PORTABLE CHEST 1 VIEW COMPARISON:  Chest radiograph dated 01/21/2015 FINDINGS: There is shallow inspiration. A patchy area of hazy density at the right lung base may represent scarring related to prior infection or recurrent pneumonia. Clinical correlation is recommended. Left lung base subsegmental atelectatic changes noted. There is no pleural effusion or pneumothorax. The cardiac silhouette is within normal limits. Probable small calcified left hilar lymph node. No acute osseous pathology identified. IMPRESSION: Patchy area of hazy density at the right lung base may represent scarring related to prior infection or recurrent pneumonia. Clinical correlation and follow-up recommended. Electronically Signed   By: Elgie Collard M.D.   On: 07/10/2016 20:07    Scheduled Meds: . aspirin EC  81 mg Oral Daily  . azithromycin (ZITHROMAX) 500 MG IVPB  500 mg Intravenous Q24H  . cefTRIAXone (ROCEPHIN)  IV  1 g Intravenous Q24H  . donepezil  5 mg Oral QHS  . finasteride  5 mg Oral Daily  . potassium chloride  40 mEq Oral BID  .  pravastatin  40 mg Oral q1800  . sodium chloride flush  10-40 mL Intracatheter Q12H  . tamsulosin  0.4 mg Oral QPC supper   Continuous Infusions: . sodium chloride 100 mL/hr at 07/11/16 1300     LOS: 1 day   Max Martinez, Scheryl Marten, MD [facility] Pager [phone]  If 7PM-7AM, please contact night-coverage [email or web address] Password [email or web address] 07/11/2016, 1:13 PM

## 2016-07-11 NOTE — Progress Notes (Signed)
Pharmacy Antibiotic Note  Max Martinez is a 83 y.o. male admitted on 07/10/2016 with CAP w/ signs of sepsis.  Pharmacy has been consulted for Rocephin and azithromycin dosing.  Plan: Rocephin 1g IV Q24H. Azithro 500mg  IV Q24H.  Temp (24hrs), Avg:101.8 F (38.8 C), Min:100.7 F (38.2 C), Max:102.9 F (39.4 C)   Recent Labs Lab 07/10/16 2057 07/10/16 2131 07/10/16 2152 07/10/16 2305  WBC  --  4.4  --   --   CREATININE 1.19  --   --   --   LATICACIDVEN  --   --  3.72* 2.49*     No Known Allergies   Thank you for allowing pharmacy to be a part of this patient's care.  Max GamblesVeronda Erma Martinez, PharmD, BCPS  07/11/2016 12:06 AM

## 2016-07-11 NOTE — Progress Notes (Signed)
With audible wheezing, breathing treatment given by RT. With relief after few min.

## 2016-07-11 NOTE — Progress Notes (Signed)
Peripherally Inserted Central Catheter/Midline Placement  The IV Nurse has discussed with the patient and/or persons authorized to consent for the patient, the purpose of this procedure and the potential benefits and risks involved with this procedure.  The benefits include less needle sticks, lab draws from the catheter, and the patient may be discharged home with the catheter. Risks include, but not limited to, infection, bleeding, blood clot (thrombus formation), and puncture of an artery; nerve damage and irregular heartbeat and possibility to perform a PICC exchange if needed/ordered by physician.  Alternatives to this procedure were also discussed.  Bard Power PICC patient education guide, fact sheet on infection prevention and patient information card has been provided to patient /or left at bedside.    PICC/Midline Placement Documentation   Telephone consent signed by Daughter   Max Martinez, Max Martinez 07/11/2016, 12:51 PM

## 2016-07-11 NOTE — Progress Notes (Signed)
PHARMACY - PHYSICIAN COMMUNICATION CRITICAL VALUE ALERT - BLOOD CULTURE IDENTIFICATION (BCID)  Results for orders placed or performed during the hospital encounter of 07/10/16  Blood Culture ID Panel (Reflexed) (Collected: 07/10/2016  9:40 PM)  Result Value Ref Range   Enterococcus species NOT DETECTED NOT DETECTED   Listeria monocytogenes NOT DETECTED NOT DETECTED   Staphylococcus species NOT DETECTED NOT DETECTED   Staphylococcus aureus NOT DETECTED NOT DETECTED   Streptococcus species NOT DETECTED NOT DETECTED   Streptococcus agalactiae NOT DETECTED NOT DETECTED   Streptococcus pneumoniae NOT DETECTED NOT DETECTED   Streptococcus pyogenes NOT DETECTED NOT DETECTED   Acinetobacter baumannii NOT DETECTED NOT DETECTED   Enterobacteriaceae species DETECTED (A) NOT DETECTED   Enterobacter cloacae complex NOT DETECTED NOT DETECTED   Escherichia coli DETECTED (A) NOT DETECTED   Klebsiella oxytoca NOT DETECTED NOT DETECTED   Klebsiella pneumoniae NOT DETECTED NOT DETECTED   Proteus species NOT DETECTED NOT DETECTED   Serratia marcescens NOT DETECTED NOT DETECTED   Carbapenem resistance NOT DETECTED NOT DETECTED   Haemophilus influenzae NOT DETECTED NOT DETECTED   Neisseria meningitidis NOT DETECTED NOT DETECTED   Pseudomonas aeruginosa NOT DETECTED NOT DETECTED   Candida albicans NOT DETECTED NOT DETECTED   Candida glabrata NOT DETECTED NOT DETECTED   Candida krusei NOT DETECTED NOT DETECTED   Candida parapsilosis NOT DETECTED NOT DETECTED   Candida tropicalis NOT DETECTED NOT DETECTED   83 year old male admitted with pneumonia. He now has 1/2 blood cultures with Gram negative rods. E coli is detected on BCID. He is on Rocephin which is the recommended empiric treatment for E coli bacteremia. I will increase his dose from a pneumonia dose to a bacteremia dose per protocol.   Name of physician (or Provider) Contacted: Dr. Rhona Leavenshiu via [facility] text [facility]  Changes to prescribed antibiotics  required: Increase Ceftriaxone to 2g IV q24h  Estella HuskMichelle Lucresia Simic, Pharm.D., BCPS, AAHIVP Clinical Pharmacist Phone: [phone] or [phone] Pager: [phone] 07/11/2016, 2:59 PM

## 2016-07-11 NOTE — Progress Notes (Addendum)
*  PRELIMINARY RESULTS* Vascular Ultrasound Bilateral lower extremity venous duplex has been completed.  Preliminary findings: No evidence of deep vein thrombosis in the bilateral lower extremities.  Heterogeneous area behind left knee, ?popliteal aneurysm. Difficult to image due to patient confusion and movement.  Preliminary results discussed with nurse, Chat @ 15:25.   Max FischerCharlotte C Audel Martinez 07/11/2016, 3:22 PM

## 2016-07-11 NOTE — Care Management Note (Addendum)
Case Management Note  Patient Details  Name: Tsosie BillingJack F Schnitzer MRN: [id number] Date of Birth: [birth year]  Subjective/Objective:   Pt admitted with sepsis                 Action/Plan:   PTA from home with daughter Misty StanleyLisa.  Per daughter - pt has baseline dementia but able to do most of ADLs with minimal assistance.  Daughter works early morning - pt is able to eat pre-prepared breakfast alone and daughter returns home prior to lunch.  Daughter voiced concerns regarding pt returning home with current state of deconditioning - PT eval will be ordered.  CM will continue to follow for discharge needs   Expected Discharge Date:                  Expected Discharge Plan:  Skilled Nursing Facility  In-House Referral:  Clinical Social Work  Discharge planning Services  CM Consult  Post Acute Care Choice:    Choice offered to:     DME Arranged:    DME Agency:     HH Arranged:    HH Agency:     Status of Service:  In process, will continue to follow  If discussed at Long Length of Stay Meetings, dates discussed:    Additional Comments: PT recommends SNF - clinical social worker Karenann Caiconsulte Zameer Borman S, RN 07/11/2016, 3:49 PM

## 2016-07-11 NOTE — ED Notes (Signed)
Report called to 4N

## 2016-07-12 DIAGNOSIS — G301 Alzheimer's disease with late onset: Secondary | ICD-10-CM

## 2016-07-12 DIAGNOSIS — F0280 Dementia in other diseases classified elsewhere without behavioral disturbance: Secondary | ICD-10-CM

## 2016-07-12 DIAGNOSIS — I5022 Chronic systolic (congestive) heart failure: Secondary | ICD-10-CM

## 2016-07-12 DIAGNOSIS — J9601 Acute respiratory failure with hypoxia: Secondary | ICD-10-CM

## 2016-07-12 LAB — URINALYSIS, ROUTINE W REFLEX MICROSCOPIC
Bilirubin Urine: NEGATIVE
Glucose, UA: NEGATIVE mg/dL
KETONES UR: 5 mg/dL — AB
Nitrite: NEGATIVE
PROTEIN: NEGATIVE mg/dL
SQUAMOUS EPITHELIAL / LPF: NONE SEEN
Specific Gravity, Urine: 1.014 (ref 1.005–1.030)
pH: 5 (ref 5.0–8.0)

## 2016-07-12 LAB — CBC
HCT: 37.1 % — ABNORMAL LOW (ref 39.0–52.0)
HEMOGLOBIN: 12.1 g/dL — AB (ref 13.0–17.0)
MCH: 31.1 pg (ref 26.0–34.0)
MCHC: 32.6 g/dL (ref 30.0–36.0)
MCV: 95.4 fL (ref 78.0–100.0)
Platelets: 95 10*3/uL — ABNORMAL LOW (ref 150–400)
RBC: 3.89 MIL/uL — ABNORMAL LOW (ref 4.22–5.81)
RDW: 14.6 % (ref 11.5–15.5)
WBC: 8 10*3/uL (ref 4.0–10.5)

## 2016-07-12 LAB — BASIC METABOLIC PANEL
ANION GAP: 11 (ref 5–15)
BUN: 22 mg/dL — ABNORMAL HIGH (ref 6–20)
CALCIUM: 7.6 mg/dL — AB (ref 8.9–10.3)
CO2: 21 mmol/L — ABNORMAL LOW (ref 22–32)
Chloride: 108 mmol/L (ref 101–111)
Creatinine, Ser: 0.93 mg/dL (ref 0.61–1.24)
GFR calc Af Amer: >60 mL/min (ref >60)
GFR calc non Af Amer: >60 mL/min (ref >60)
GLUCOSE: 104 mg/dL — AB (ref 65–99)
Potassium: 3.6 mmol/L (ref 3.5–5.1)
Sodium: 140 mmol/L (ref 135–145)

## 2016-07-12 MED ORDER — METOPROLOL TARTRATE 25 MG PO TABS
25.0000 mg | ORAL_TABLET | Freq: Two times a day (BID) | ORAL | Status: DC
  Administered 2016-07-12 – 2016-07-16 (×9): 25 mg via ORAL
  Filled 2016-07-12 (×11): qty 1

## 2016-07-12 MED ORDER — HYDRALAZINE HCL 20 MG/ML IJ SOLN
10.0000 mg | INTRAMUSCULAR | Status: DC | PRN
  Filled 2016-07-12: qty 1

## 2016-07-12 MED ORDER — LORAZEPAM 2 MG/ML IJ SOLN
1.0000 mg | Freq: Once | INTRAMUSCULAR | Status: AC
  Administered 2016-07-12: 1 mg via INTRAVENOUS
  Filled 2016-07-12: qty 1

## 2016-07-12 MED ORDER — IPRATROPIUM-ALBUTEROL 0.5-2.5 (3) MG/3ML IN SOLN: 3.0000 mL | Freq: Four times a day (QID) | RESPIRATORY_TRACT | Status: DC

## 2016-07-12 MED ORDER — IPRATROPIUM-ALBUTEROL 0.5-2.5 (3) MG/3ML IN SOLN
3.0000 mL | Freq: Four times a day (QID) | RESPIRATORY_TRACT | Status: DC | PRN
  Administered 2016-07-12: 3 mL via RESPIRATORY_TRACT
  Filled 2016-07-12: qty 3

## 2016-07-12 NOTE — Clinical Social Work Note (Signed)
Clinical Social Work Assessment  Patient Details  Name: Max Martinez MRN: [id number] Date of Birth: October 11, 1924  Date of referral:  07/12/16               Reason for consult:  Facility Placement                Permission sought to share information with:  Facility Medical sales representativeContact Representative, Family Supports Permission granted to share information::  Yes, Verbal Permission Granted  Name::     IT consultantLisa  Agency::  SNFs  Relationship::  Daughter  Contact Information:  [phone]  Housing/Transportation Living arrangements for the past 2 months:  Single Family Home Source of Information:  Adult Children Patient Interpreter Needed:  None Criminal Activity/Legal Involvement Pertinent to Current Situation/Hospitalization:  No - Comment as needed Significant Relationships:  Adult Children Lives with:  Adult Children Do you feel safe going back to the place where you live?  No Need for family participation in patient care:  Yes (Comment)  Care giving concerns:  CSW received consult for possible SNF placement at time of discharge. Patient is disoriented. CSW spoke with patient's daughter, Max StanleyLisa, regarding PT recommendation of SNF placement at time of discharge. Patient's daughter reported that she is currently unable to care for patient at their home given patient's current physical needs and fall risk. Patient's daughter expressed understanding of PT recommendation and is agreeable to SNF placement at time of discharge. CSW to continue to follow and assist with discharge planning needs.   Social Worker assessment / plan:  CSW spoke with patient's daughter concerning [facility] before [facility].  Employment status:  Retired Database administratornsurance information:  Managed Medicare PT Recommendations:  Skilled Nursing Facility Information / Referral to community resources:  Skilled Nursing Facility  Patient/Family's Response to care:  Patient's daughter recognizes need for rehab before returning  home and [place]. Max StanleyLisa reported preference for [facility] since he has been there before.  Patient/Family's Understanding of and Emotional Response to Diagnosis, Current Treatment, and Prognosis:  Patient/family is realistic regarding therapy needs and expressed being hopeful for SNF placement. Patient's daughter stated that she really hopes patient can get stronger at [facility] like he did during his last rehab stay there. Patient's daughter expressed understanding of CSW role and discharge process. No questions/concerns about plan or treatment.    Emotional Assessment Appearance:  Appears stated age Attitude/Demeanor/Rapport:  Unable to Assess Affect (typically observed):  Unable to Assess Orientation:  Oriented to Self Alcohol / Substance use:  Not Applicable Psych involvement (Current and /or in the community):  No (Comment)  Discharge Needs  Concerns to be addressed:  Care Coordination Readmission within the last 30 days:  No Current discharge risk:  None Barriers to Discharge:  Continued Medical Work up   [employer] S Max Martinez, LCSWA 07/12/2016, 3:23 PM

## 2016-07-12 NOTE — Evaluation (Signed)
Physical Therapy Evaluation Patient Details Name: Max Martinez MRN: [id number] DOB: 1925/02/15 Today's Date: 07/12/2016   History of Present Illness  Pt is an 83 y/o male admitted secondary to fever, cough and sepsis. PMH including but not limited to dementia, CAD, CHF, a-fib, s/p TAVR in 2016.  Clinical Impression  Pt presented sitting OOB in recliner chair with sitter present throughout. Pt unable to provide reliable information regarding PLOF and no family/caregivers present to provide information. Pt currently requires mod A x2 for transfers and mod A x2 for safety with ambulation using a RW. All VSS throughout. Pt would continue to benefit from skilled physical therapy services at this time while admitted and after d/c to address his below listed limitations in order to improve his overall safety and independence with functional mobility.      Follow Up Recommendations SNF;Supervision/Assistance - 24 hour    Equipment Recommendations  None recommended by PT    Recommendations for Other Services       Precautions / Restrictions Precautions Precautions: Fall Restrictions Weight Bearing Restrictions: No      Mobility  Bed Mobility               General bed mobility comments: sitting OOB in recliner chair when therapist entered room  Transfers Overall transfer level: Needs assistance Equipment used: Rolling walker (2 wheeled) Transfers: Sit to/from Stand Sit to Stand: Mod assist;+2 physical assistance;+2 safety/equipment         General transfer comment: increased time, verbal and tactile cueing for bilateral hand placement and mod A x2 to rise from recliner chair x1 and from bed x1  Ambulation/Gait Ambulation/Gait assistance: Mod assist;+2 safety/equipment Ambulation Distance (Feet): 20 Feet (20' x2 with sitting rest break) Assistive device: Rolling walker (2 wheeled) Gait Pattern/deviations: Step-through pattern;Decreased stride length;Drifts right/left Gait  velocity: decreased   General Gait Details: pt with modest instability with ambulation requiring mod A x2 for safety with ambulation using a RW. pt also requiring frequent physical assistance with RW for directional guidance to navigate around obstacles  Stairs            Wheelchair Mobility    Modified Rankin (Stroke Patients Only)       Balance Overall balance assessment: Needs assistance Sitting-balance support: Feet supported Sitting balance-[name]: Fair Sitting balance - Comments: close min guard for safety   Standing balance support: During functional activity;Bilateral upper extremity supported Standing balance-[name]: Poor Standing balance comment: pt reliant on bilateral UEs on RW                             Pertinent Vitals/Pain Pain Assessment: No/denies pain    Home Living Family/patient expects to be discharged to:: Unsure                 Additional Comments: pt with history of dementia with cognitive deficits at baseline and no family/caregivers present to provide reliable information    Prior Function           Comments: pt stated that he does not use an AD to ambulate; however, pt with cognitive deficits at baseline (dementia) and no family/caregivers present to provide reliable information     Hand Dominance        Extremity/Trunk Assessment   Upper Extremity Assessment Upper Extremity Assessment: Overall WFL for tasks assessed    Lower Extremity Assessment Lower Extremity Assessment: Generalized weakness       Communication  Communication: No difficulties  Cognition Arousal/Alertness: Awake/alert Behavior During Therapy: WFL for tasks assessed/performed Overall Cognitive Status: History of cognitive impairments - at baseline Area of Impairment: Orientation;Attention;Memory;Following commands;Safety/judgement;Awareness;Problem solving Orientation Level: Disoriented to;Place;Situation Current Attention  Level: Focused Memory: Decreased short-term memory Following Commands: Follows one step commands with increased time;Follows one step commands consistently Safety/Judgement: Decreased awareness of safety;Decreased awareness of deficits   Problem Solving: Slow processing;Decreased initiation;Difficulty sequencing;Requires verbal cues      General Comments      Exercises     Assessment/Plan    PT Assessment Patient needs continued PT services  PT Problem List Decreased strength;Decreased balance;Decreased mobility;Decreased coordination;Decreased cognition;Decreased knowledge of use of DME;Decreased safety awareness;Decreased knowledge of precautions       PT Treatment Interventions DME instruction;Gait training;Stair training;Functional mobility training;Therapeutic activities;Therapeutic exercise;Balance training;Neuromuscular re-education;Cognitive remediation;Patient/family education    PT Goals (Current goals can be found in the Care Plan section)  Acute Rehab PT Goals Patient Stated Goal: "go to Max Martinez's house" PT Goal Formulation: Patient unable to participate in goal setting Time For Goal Achievement: 07/26/16 Potential to Achieve Goals: Fair    Frequency Min 2X/week   Barriers to discharge        Co-evaluation               End of Session Equipment Utilized During Treatment: Gait belt Activity Tolerance: Patient tolerated treatment well Patient left: in chair;with call bell/phone within reach;with nursing/sitter in room Psychiatrist(sitter present) Nurse Communication: Mobility status PT Visit Diagnosis: Other abnormalities of gait and mobility (R26.89)         Time: [id number] PT Time Calculation (min) (ACUTE ONLY): 21 min   Charges:   PT Evaluation $PT Eval Moderate Complexity: 1 Procedure     PT G CodesAlessandra Bevels:         Philis Doke M Ayman Brull 07/12/2016, 2:14 PM Deborah ChalkJennifer Khaleb Broz, PT, DPT [phone]

## 2016-07-12 NOTE — [place] FL2 (Signed)
[place] MEDICAID FL2 LEVEL OF CARE SCREENING TOOL     IDENTIFICATION  Patient Name: Max Martinez Birthdate: 27-Jan-1925 Sex: male Admission Date (Current Location): 07/10/2016  [facility] [place] [place]   Facility and Address:  [facility] [place], [address], [place], [place] [address]      Provider Number: [id number]  Attending Physician Name and Address:  Jerald KiefStephen K Chiu, MD  Relative Name and Phone Number:  Misty StanleyLisa, daughter    Current Level of Care: Hospital Recommended Level of Care: Skilled Nursing Facility Prior Approval Number:    Date Approved/Denied:   PASRR Number: [id number]  Discharge Plan: SNF    Current Diagnoses: Patient Active Problem List   Diagnosis Date Noted  . Sepsis (HCC) 07/10/2016  . Hypertension, essential 07/10/2016  . 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 Coronary arteriosclerosis due to lipid rich plaque 07/10/2016  . S/P TAVR (transcatheter aortic valve replacement) 07/10/2016  . Chronic systolic CHF (congestive heart failure) (HCC) 07/10/2016  . Aortic stenosis 07/10/2016  . Community acquired pneumonia 07/10/2016  . Aspiration pneumonia due to vomit (HCC) 01/22/2015  . UGI bleed 01/22/2015  . Paroxysmal atrial fibrillation (HCC) 01/22/2015  . Acute diarrhea   . Hematemesis with nausea   . Thrombocytopenia (HCC) 12/03/2008    Orientation RESPIRATION BLADDER Height & Weight     Self  Normal Continent Weight: 70.8 kg (156 lb) Height:  5\' 7"  (170.2 cm)  BEHAVIORAL SYMPTOMS/MOOD NEUROLOGICAL BOWEL NUTRITION STATUS      Incontinent Diet (Please see DC Summary)  AMBULATORY STATUS COMMUNICATION OF NEEDS Skin   Limited Assist Verbally Normal                       Personal Care Assistance Level of Assistance  Bathing, Feeding, Dressing Bathing Assistance: Maximum assistance Feeding assistance: Independent Dressing Assistance: Limited  assistance     Functional Limitations Info             SPECIAL CARE FACTORS FREQUENCY  PT (By licensed PT)     PT Frequency: 5x/week              Contractures      Additional Factors Info  Code Status, Allergies Code Status Info: DNR Allergies Info: NKA           Current Medications (07/12/2016):  This is the current hospital active medication list Current Facility-Administered Medications  Medication Dose Route Frequency Provider Last Rate Last Dose  . 0.9 %  sodium chloride infusion   Intravenous Continuous Alberteen Samhristopher P Danford, MD 100 mL/hr at 07/12/16 1221    . acetaminophen (TYLENOL) tablet 650 mg  650 mg Oral Q6H PRN Alberteen Samhristopher P Danford, MD       Or  . acetaminophen (TYLENOL) suppository 650 mg  650 mg Rectal Q6H PRN Alberteen Samhristopher P Danford, MD      . aspirin EC tablet 81 mg  81 mg Oral Daily Alberteen Samhristopher P Danford, MD   81 mg at 07/12/16 0918  . azithromycin (ZITHROMAX) 500 mg in dextrose 5 % 250 mL IVPB  500 mg Intravenous Q24H Juliette MangleVeronda P Bryk, RPH   500 mg at 07/11/16 2304  . cefTRIAXone (ROCEPHIN) 2 g in dextrose 5 % 50 mL IVPB  2 g Intravenous Q24H Sallee ProvencalMichelle S Turner, RPH   2 g at 07/12/16 1516  . donepezil (ARICEPT) tablet 5 mg  5 mg Oral QHS Christopher P Danford,  MD   5 mg at 07/11/16 2303  . finasteride (PROSCAR) tablet 5 mg  5 mg Oral Daily Alberteen Sam, MD   5 mg at 07/12/16 0918  . hydrALAZINE (APRESOLINE) injection 10 mg  10 mg Intravenous Q4H PRN Jerald Kief, MD      . metoprolol tartrate (LOPRESSOR) tablet 25 mg  25 mg Oral BID Jerald Kief, MD   25 mg at 07/12/16 [phone]  . ondansetron (ZOFRAN) tablet 4 mg  4 mg Oral Q6H PRN Alberteen Sam, MD       Or  . ondansetron (ZOFRAN) injection 4 mg  4 mg Intravenous Q6H PRN Alberteen Sam, MD      . pravastatin (PRAVACHOL) tablet 40 mg  40 mg Oral q1800 Alberteen Sam, MD   40 mg at 07/11/16 1716  . sodium chloride flush (NS) 0.9 % injection 10-40 mL  10-40 mL Intracatheter  Q12H Jerald Kief, MD   10 mL at 07/12/16 0919  . sodium chloride flush (NS) 0.9 % injection 10-40 mL  10-40 mL Intracatheter PRN Jerald Kief, MD   10 mL at 07/11/16 1717  . tamsulosin (FLOMAX) capsule 0.4 mg  0.4 mg Oral QPC supper Alberteen Sam, MD   0.4 mg at 07/11/16 1716     Discharge Medications: Please see discharge summary for a list of discharge medications.  Relevant Imaging Results:  Relevant Lab Results:   Additional Information SSN: [id number] [place], [place]

## 2016-07-12 NOTE — Significant Event (Signed)
Patient transferred safely to 6E27, taken via wheelchair. VS stable prior and during the transfer. Report given to receiving RN Mardelle MatteAndy. Patient settled in bed, bed alarm on. Daughter at bedside. All personal belongings (in a suitcase) are taken to new room. Staff in room prior to RN leaving.    Max Martinez

## 2016-07-12 NOTE — Progress Notes (Signed)
I have reviewed the u/s images and he does appear to have a popliteal aneurysm.  Size can not be determined on this study.  Therefore, I would recommend getting a CTA abd/pelvis with bilateral runoff when he is medically appropriate.  Please contact [facility] once the CT scan has been performed and we will consult on him for further directions and plans  Wells Shun Pletz

## 2016-07-12 NOTE — Progress Notes (Signed)
PROGRESS NOTE    Max Martinez  ZOX:[id number] DOB: June 30, 1924 DOA: 07/10/2016 PCP: Pcp Not In System    Brief Narrative:  83 y.o. male with a past medical history significant for dementia, HTN, CAD, AS s/p TAVR in 2016, Afib not on anticoagulation and CHF EF 45% who presents with fever, cough, vomiting for 1 day. Patient was found to have R basilar PNA with sepsis and was admitted for further work up..  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 Pneumonia with UTI and ecoli bactermia with sepsis present on admission:  - CXR with findings of haziness at R lung base that may be scarring from prior infection vs recurrent PNA - Patient meets criteria given tachycardia, tachypnea, fever, and evidence of organ dysfunction. - Pt with ecoli bacteremia, sensitivities pending. UA suggestive of possible UTI   - For now, pt is continued on azitrhomycin with rocephin - Pt remains afebrile. Not tachycardic. Clinically improved  2. Chronic systolic and diastolic CHF, in setting of AS s/p TAVR:  - Hypovolemic in setting of sepsis.  Patient no longer on furosemide, BB, ACEi. -Continue with strict I/Os, daily weights - Repeat BMET in AM  3. Hypertension and coronary secondary prevention:  - Pt noted to be hypotensive at admission -Continue aspirin, statin - Pt currently hypertensive. Will resume metoprolol per home regimen  4. Atrial fibrillation:  - CHADS2Vasc 5, not on warfarin, presumably because of age, frailty.   - Have resumed metoprolol per above -Plan to continue tele  and monitor  5. Dementia:  -Will continue donepezil as tolerated -Continue to avoid sedating medicaitons if possible  6. Other medications:  -Continue Flomax and Proscar as tolerated  7. Thrombocytopenia: -Plts now improving - Suspect secondary to presenting sepsis - Recheck cbc in AM  8. 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 - LE dopplers done recently demonstrated possible L popliteal artery aneurysm, motion aritifact, difficult to image due to movement and confusion - Plan to discuss with Vascular  DVT prophylaxis: SCD's Code Status: DNR Family Communication: Pt in room, family not at bedside Disposition Plan: Uncertain at this time  Consultants:     Procedures:     Antimicrobials: Anti-infectives    Start     Dose/Rate Route Frequency Ordered Stop   07/11/16 2200  azithromycin (ZITHROMAX) 500 mg in dextrose 5 % 250 mL IVPB     500 mg 250 mL/hr over 60 Minutes Intravenous Every 24 hours 07/11/16 0007     07/11/16 2000  cefTRIAXone (ROCEPHIN) 1 g in dextrose 5 % 50 mL IVPB  Status:  Discontinued     1 g 100 mL/hr over 30 Minutes Intravenous Every 24 hours 07/11/16 0007 07/11/16 1500   07/11/16 1600  cefTRIAXone (ROCEPHIN) 2 g in dextrose 5 % 50 mL IVPB     2 g 100 mL/hr over 30 Minutes Intravenous Every 24 hours 07/11/16 1500     07/10/16 2100  cefTRIAXone (ROCEPHIN) 1 g in dextrose 5 % 50 mL IVPB     1 g 100 mL/hr over 30 Minutes Intravenous  Once 07/10/16 2054 07/10/16  2244   07/10/16 2100  azithromycin (ZITHROMAX) 500 mg in dextrose 5 % 250 mL IVPB     500 mg 250 mL/hr over 60 Minutes Intravenous  Once 07/10/16 2054 07/10/16 2315      Subjective: Reports feeling better  Objective: Vitals:   07/12/16 0900 07/12/16 1000 07/12/16 1100 07/12/16 1124  BP:    126/73  Pulse: (!) 115   72  Resp:  (!) 23 14 (!) 27  Temp:    99.1 F (37.3 C)  TempSrc:    Oral  SpO2: 90%   91%  Weight:      Height:        Intake/Output Summary (Last 24 hours) at 07/12/16  1344 Last data filed at 07/12/16 1100  Gross per 24 hour  Intake          3601.67 ml  Output             1105 ml  Net          2496.67 ml   Filed Weights   07/11/16 0600  Weight: 70.8 kg (156 lb)    Examination:  General exam: Awake, laying in bed, in nad Respiratory system: normal resp effort, no audible wheezing Cardiovascular system: regular rate, s1-s2 Gastrointestinal system: soft, nondistended, pos BS. Central nervous system: CN2-12 grossly intact, strength intact. Extremities: perfused, no clubbing Skin: normal skin turgor, no notable skin lesions seen Psychiatry: remains mildly confused. No visual hallucinations.   Data Reviewed: I have personally reviewed following labs and imaging studies  CBC:  Recent Labs Lab 07/10/16 2131 07/11/16 0434 07/12/16 0520  WBC 4.4 9.0 8.0  NEUTROABS 4.1  --   --   HGB 13.9 11.5* 12.1*  HCT 41.9 35.6* 37.1*  MCV 95.7 95.4 95.4  PLT 93* 83* 95*   Basic Metabolic Panel:  Recent Labs Lab 07/10/16 2057 07/11/16 0434 07/12/16 0520  NA 141 140 140  K 3.8 3.2* 3.6  CL 104 109 108  CO2 21* 20* 21*  GLUCOSE 99 117* 104*  BUN 18 18 22*  CREATININE 1.19 1.15 0.93  CALCIUM 8.5* 7.1* 7.6*   GFR: Estimated Creatinine Clearance: 50.3 mL/min (by C-G formula based on SCr of 0.93 mg/dL). Liver Function Tests:  Recent Labs Lab 07/10/16 2057 07/11/16 0434  AST 41 30  ALT 22 18  ALKPHOS 53 39  BILITOT 0.9 0.7  PROT 6.6 5.6*  ALBUMIN 3.6 2.7*   No results for input(s): LIPASE, AMYLASE in the last 168 hours. No results for input(s): AMMONIA in the last 168 hours. Coagulation Profile: No results for input(s): INR, PROTIME in the last 168 hours. Cardiac Enzymes: No results for input(s): CKTOTAL, CKMB, CKMBINDEX, TROPONINI in the last 168 hours. BNP (last 3 results) No results for input(s): PROBNP in the last 8760 hours. HbA1C: No results for input(s): HGBA1C in the last 72 hours. CBG:  Recent Labs Lab 07/11/16 0733    GLUCAP 108*   Lipid Profile: No results for input(s): CHOL, HDL, LDLCALC, TRIG, CHOLHDL, LDLDIRECT in the last 72 hours. Thyroid Function Tests: No results for input(s): TSH, T4TOTAL, FREET4, T3FREE, THYROIDAB in the last 72 hours. Anemia Panel: No results for input(s): VITAMINB12, FOLATE, FERRITIN, TIBC, IRON, RETICCTPCT in the last 72 hours. Sepsis Labs:  Recent Labs Lab 07/10/16 2152 07/10/16 2305 07/11/16 0434 07/11/16 0712  LATICACIDVEN 3.72* 2.49* 1.8 1.9    Recent Results (from the past 240 hour(s))  Blood Culture (routine x 2)     Status: None (Preliminary  result)   Collection Time: 07/10/16  9:00 PM  Result Value Ref Range Status   Specimen Description BLOOD LEFT ARM  Final   Special Requests IN PEDIATRIC BOTTLE 1CC  Final   Culture NO GROWTH < 24 HOURS  Final   Report Status PENDING  Incomplete  Blood Culture (routine x 2)     Status: None (Preliminary result)   Collection Time: 07/10/16  9:40 PM  Result Value Ref Range Status   Specimen Description BLOOD LEFT FOOT  Final   Special Requests BOTTLES DRAWN AEROBIC ONLY 5CC  Final   Culture  Setup Time   Final    GRAM NEGATIVE RODS AEROBIC BOTTLE ONLY CRITICAL RESULT CALLED TO, READ BACK BY AND VERIFIED WITH: Edd ArbourM. Turner Pharm.D. 14:50 07/11/16 ([email or web address])    Culture   Final    GRAM NEGATIVE RODS IDENTIFICATION AND SUSCEPTIBILITIES TO FOLLOW    Report Status PENDING  Incomplete  Blood Culture ID Panel (Reflexed)     Status: Abnormal   Collection Time: 07/10/16  9:40 PM  Result Value Ref Range Status   Enterococcus species NOT DETECTED NOT DETECTED Final   Listeria monocytogenes NOT DETECTED NOT DETECTED Final   Staphylococcus species NOT DETECTED NOT DETECTED Final   Staphylococcus aureus NOT DETECTED NOT DETECTED Final   Streptococcus species NOT DETECTED NOT DETECTED Final   Streptococcus agalactiae NOT DETECTED NOT DETECTED Final   Streptococcus pneumoniae NOT DETECTED NOT DETECTED Final   Streptococcus  pyogenes NOT DETECTED NOT DETECTED Final   Acinetobacter baumannii NOT DETECTED NOT DETECTED Final   Enterobacteriaceae species DETECTED (A) NOT DETECTED Final    Comment: Enterobacteriaceae represent a large family of gram-negative bacteria, not a single organism. CRITICAL RESULT CALLED TO, READ BACK BY AND VERIFIED WITH: M. Mayford Knifeurner Pharm.D. 14:50 07/11/16 ([email or web address])    Enterobacter cloacae complex NOT DETECTED NOT DETECTED Final   Escherichia coli DETECTED (A) NOT DETECTED Final    Comment: CRITICAL RESULT CALLED TO, READ BACK BY AND VERIFIED WITH: M. Mayford Knifeurner Pharm.D. 14:50 07/11/16 ([email or web address])    Klebsiella oxytoca NOT DETECTED NOT DETECTED Final   Klebsiella pneumoniae NOT DETECTED NOT DETECTED Final   Proteus species NOT DETECTED NOT DETECTED Final   Serratia marcescens NOT DETECTED NOT DETECTED Final   Carbapenem resistance NOT DETECTED NOT DETECTED Final   Haemophilus influenzae NOT DETECTED NOT DETECTED Final   Neisseria meningitidis NOT DETECTED NOT DETECTED Final   Pseudomonas aeruginosa NOT DETECTED NOT DETECTED Final   Candida albicans NOT DETECTED NOT DETECTED Final   Candida glabrata NOT DETECTED NOT DETECTED Final   Candida krusei NOT DETECTED NOT DETECTED Final   Candida parapsilosis NOT DETECTED NOT DETECTED Final   Candida tropicalis NOT DETECTED NOT DETECTED Final  MRSA PCR Screening     Status: None   Collection Time: 07/11/16  6:00 AM  Result Value Ref Range Status   MRSA by PCR NEGATIVE NEGATIVE Final    Comment:        The GeneXpert MRSA Assay (FDA approved for NASAL specimens only), is one component of a comprehensive MRSA colonization surveillance program. It is not intended to diagnose MRSA infection nor to guide or monitor treatment for MRSA infections.      Radiology Studies: Dg Chest Port 1 View  Result Date: 07/10/2016 CLINICAL DATA:  83 year old male with fever. EXAM: PORTABLE CHEST 1 VIEW COMPARISON:  Chest radiograph dated 01/21/2015 FINDINGS:  There is shallow inspiration. A patchy area of hazy density at the right lung base may represent scarring  related to prior infection or recurrent pneumonia. Clinical correlation is recommended. Left lung base subsegmental atelectatic changes noted. There is no pleural effusion or pneumothorax. The cardiac silhouette is within normal limits. Probable small calcified left hilar lymph node. No acute osseous pathology identified. IMPRESSION: Patchy area of hazy density at the right lung base may represent scarring related to prior infection or recurrent pneumonia. Clinical correlation and follow-up recommended. Electronically Signed   By: Elgie Collard M.D.   On: 07/10/2016 20:07    Scheduled Meds: . aspirin EC  81 mg Oral Daily  . azithromycin (ZITHROMAX) 500 MG IVPB  500 mg Intravenous Q24H  . cefTRIAXone (ROCEPHIN)  IV  2 g Intravenous Q24H  . donepezil  5 mg Oral QHS  . finasteride  5 mg Oral Daily  . metoprolol tartrate  25 mg Oral BID  . pravastatin  40 mg Oral q1800  . sodium chloride flush  10-40 mL Intracatheter Q12H  . tamsulosin  0.4 mg Oral QPC supper   Continuous Infusions: . sodium chloride 100 mL/hr at 07/12/16 1221     LOS: 2 days   [employer], Scheryl Marten, MD [facility] Pager [phone]  If 7PM-7AM, please contact night-coverage [email or web address] Password [email or web address] 07/12/2016, 1:44 PM

## 2016-07-13 ENCOUNTER — Inpatient Hospital Stay (HOSPITAL_COMMUNITY): Payer: Medicare HMO

## 2016-07-13 LAB — BASIC METABOLIC PANEL
Anion gap: 7 (ref 5–15)
BUN: 18 mg/dL (ref 6–20)
CHLORIDE: 109 mmol/L (ref 101–111)
CO2: 24 mmol/L (ref 22–32)
Calcium: 7.6 mg/dL — ABNORMAL LOW (ref 8.9–10.3)
Creatinine, Ser: 0.77 mg/dL (ref 0.61–1.24)
GFR calc non Af Amer: >60 mL/min (ref >60)
Glucose, Bld: 101 mg/dL — ABNORMAL HIGH (ref 65–99)
POTASSIUM: 3.9 mmol/L (ref 3.5–5.1)
SODIUM: 140 mmol/L (ref 135–145)

## 2016-07-13 LAB — CBC
HCT: 35.3 % — ABNORMAL LOW (ref 39.0–52.0)
HEMOGLOBIN: 11.5 g/dL — AB (ref 13.0–17.0)
MCH: 31.2 pg (ref 26.0–34.0)
MCHC: 32.6 g/dL (ref 30.0–36.0)
MCV: 95.7 fL (ref 78.0–100.0)
PLATELETS: 103 10*3/uL — AB (ref 150–400)
RBC: 3.69 MIL/uL — AB (ref 4.22–5.81)
RDW: 14.8 % (ref 11.5–15.5)
WBC: 8 10*3/uL (ref 4.0–10.5)

## 2016-07-13 LAB — CULTURE, BLOOD (ROUTINE X 2)

## 2016-07-13 LAB — GLUCOSE, CAPILLARY: GLUCOSE-CAPILLARY: 129 mg/dL — AB (ref 65–99)

## 2016-07-13 LAB — URINE CULTURE

## 2016-07-13 MED ORDER — AZITHROMYCIN 500 MG PO TABS
500.0000 mg | ORAL_TABLET | Freq: Every day | ORAL | Status: DC
  Administered 2016-07-13 – 2016-07-17 (×5): 500 mg via ORAL
  Filled 2016-07-13 (×5): qty 1

## 2016-07-13 MED ORDER — FUROSEMIDE 10 MG/ML IJ SOLN
40.0000 mg | Freq: Two times a day (BID) | INTRAMUSCULAR | Status: DC
Start: 2016-07-13 — End: 2016-07-16
  Administered 2016-07-13 – 2016-07-15 (×5): 40 mg via INTRAVENOUS
  Filled 2016-07-13 (×6): qty 4

## 2016-07-13 MED ORDER — FUROSEMIDE 10 MG/ML IJ SOLN
40.0000 mg | Freq: Once | INTRAMUSCULAR | Status: AC
Start: 2016-07-13 — End: 2016-07-13
  Administered 2016-07-13: 40 mg via INTRAVENOUS
  Filled 2016-07-13: qty 4

## 2016-07-13 MED ORDER — ORAL CARE MOUTH RINSE
15.0000 mL | Freq: Two times a day (BID) | OROMUCOSAL | Status: DC
  Administered 2016-07-14 – 2016-07-17 (×4): 15 mL via OROMUCOSAL

## 2016-07-13 NOTE — Progress Notes (Signed)
Pulled condom cath twice this shift. Patient is alert and oriented to self and place. Peri care rendered complete bed change twice. Mittens applied.

## 2016-07-13 NOTE — Progress Notes (Addendum)
PROGRESS NOTE    Max Martinez  WJX:[id number] DOB: [birth year] DOA: 07/10/2016 PCP: Pcp Not In System    Brief Narrative:  83 y.o. male with a past medical history significant for dementia, HTN, CAD, AS s/p TAVR in 2016, Afib not on anticoagulation and CHF EF 45% who presents with fever, cough, vomiting for 1 day. Patient was found to have R basilar PNA with sepsis and was admitted for further work up..  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 Pneumonia with UTI and ecoli bactermia with sepsis present on admission:  - CXR with findings of haziness at R lung base that may be scarring from prior infection vs recurrent PNA - Patient meets criteria given tachycardia, tachypnea, fever, and evidence of organ dysfunction. - Pt noted to have ecoli bacteremia that is cipro, ampicillin, and bactrim resistant. UA suggestive of possible UTI   - Urine cx with insignificant growth, although urine cx was obtained after broad abx were already started - Pt now on azithromycin and rocephin - Pt currently remains afebrile. Not tachycardic  2. Possible acute on Chronic systolic and diastolic CHF, in setting of AS s/p TAVR:  - Initially hypovolemic in setting of sepsis.  Patient no longer on furosemide, BB, ACEi. -Patient with increase O2 requirements, new finding -CXR ordered today with findings of likely asymmetric pulm edema per my read - Patient given one dose of lasix with some improvement. Will continue scheduled IV lasix bid - Repeat bmet in AM  3. Hypertension and coronary  secondary prevention:  - Pt noted to be hypotensive at admission -Continue aspirin, statin - Pt currently hypertensive. Have resumed metoprolol per home regimen  4. Atrial fibrillation:  - CHADS2Vasc 5, not on warfarin, presumably because of age, frailty.   - Have resumed metoprolol per above -Will continue tele and monitor  5. Dementia:  -Will continue donepezil as tolerated -Will continue to avoid sedating medicaitons if possible  6. Other medications:  -Plan to continue Flomax and Proscar as tolerated  7. Thrombocytopenia: -Plts are slowly improving - Suspect secondary to presenting sepsis - Repeat cbc in AM  8. 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 Recommendations appreciated. - When pt is more stable, would plan on CTA abd/pelvis with bilateral run-off as recommended  DVT prophylaxis: SCD's Code Status: DNR Family Communication: Pt in room, family not at bedside Disposition Plan: Uncertain at this time  Consultants:   Discussed case with Vascular Surgery over phone  Procedures:     Antimicrobials: Anti-infectives    Start     Dose/Rate Route Frequency Ordered Stop   07/13/16 2200  azithromycin (ZITHROMAX) tablet 500 mg     500 mg Oral Daily 07/13/16 0720     07/11/16 2200  azithromycin (ZITHROMAX) 500 mg in dextrose 5 % 250 mL IVPB  Status:  Discontinued     500 mg 250 mL/hr over 60 Minutes Intravenous Every 24 hours 07/11/16 0007 07/13/16 0720  07/11/16 2000  cefTRIAXone (ROCEPHIN) 1 g in dextrose 5 % 50 mL IVPB  Status:  Discontinued     1 g 100 mL/hr over 30 Minutes Intravenous Every 24 hours 07/11/16 0007 07/11/16 1500   07/11/16 1600  cefTRIAXone (ROCEPHIN) 2 g in dextrose 5 % 50 mL IVPB     2 g 100 mL/hr over 30 Minutes Intravenous Every 24 hours 07/11/16 1500      07/10/16 2100  cefTRIAXone (ROCEPHIN) 1 g in dextrose 5 % 50 mL IVPB     1 g 100 mL/hr over 30 Minutes Intravenous  Once 07/10/16 2054 07/10/16 2244   07/10/16 2100  azithromycin (ZITHROMAX) 500 mg in dextrose 5 % 250 mL IVPB     500 mg 250 mL/hr over 60 Minutes Intravenous  Once 07/10/16 2054 07/10/16 2315      Subjective: Without complaints  Objective: Vitals:   07/12/16 1605 07/12/16 2114 07/13/16 0645 07/13/16 1100  BP: (!) 155/77 (!) 165/124 (!) 173/91 (!) 154/88  Pulse: 70 84 70 66  Resp: 20 (!) 24 (!) 24 (!) 22  Temp: 97.8 F (36.6 C) 98 F (36.7 C) 97.7 F (36.5 C) 98.2 F (36.8 C)  TempSrc: Oral Oral Oral Oral  SpO2: 92% (!) 85% 93% 96%  Weight:  76.1 kg (167 lb 11.2 oz)    Height:        Intake/Output Summary (Last 24 hours) at 07/13/16 1552 Last data filed at 07/13/16 1100  Gross per 24 hour  Intake             1370 ml  Output              850 ml  Net              520 ml   Filed Weights   07/11/16 0600 07/12/16 2114  Weight: 70.8 kg (156 lb) 76.1 kg (167 lb 11.2 oz)    Examination:  General exam: asleep, easily arousable, in nad Respiratory system: midly increased resp effort, coarse breathsounds, end-expiratory wheezing Cardiovascular system: regular rhythm, s1-s2 auscultated Gastrointestinal system: pos BS, soft, nondistended. Central nervous system: no seizures, no tremors Extremities: no cyanosis, no joint deformities Skin: no rashes, no pallor Psychiatry: coversant, mildly confused// no visual hallucinations   Data Reviewed: I have personally reviewed following labs and imaging studies  CBC:  Recent Labs Lab 07/10/16 2131 07/11/16 0434 07/12/16 0520 07/13/16 0440  WBC 4.4 9.0 8.0 8.0  NEUTROABS 4.1  --   --   --   HGB 13.9 11.5* 12.1* 11.5*  HCT 41.9 35.6* 37.1* 35.3*  MCV 95.7 95.4 95.4 95.7  PLT 93* 83* 95* 103*   Basic Metabolic Panel:  Recent Labs Lab 07/10/16 2057 07/11/16 0434 07/12/16 0520 07/13/16 0440  NA 141 140  140 140  K 3.8 3.2* 3.6 3.9  CL 104 109 108 109  CO2 21* 20* 21* 24  GLUCOSE 99 117* 104* 101*  BUN 18 18 22* 18  CREATININE 1.19 1.15 0.93 0.77  CALCIUM 8.5* 7.1* 7.6* 7.6*   GFR: Estimated Creatinine Clearance: 58.5 mL/min (by C-G formula based on SCr of 0.77 mg/dL). Liver Function Tests:  Recent Labs Lab 07/10/16 2057 07/11/16 0434  AST 41 30  ALT 22 18  ALKPHOS 53 39  BILITOT 0.9 0.7  PROT 6.6 5.6*  ALBUMIN 3.6 2.7*   No results for input(s): LIPASE, AMYLASE in the last 168 hours. No results for input(s): AMMONIA in the last 168 hours. Coagulation Profile:  No results for input(s): INR, PROTIME in the last 168 hours. Cardiac Enzymes: No results for input(s): CKTOTAL, CKMB, CKMBINDEX, TROPONINI in the last 168 hours. BNP (last 3 results) No results for input(s): PROBNP in the last 8760 hours. HbA1C: No results for input(s): HGBA1C in the last 72 hours. CBG:  Recent Labs Lab 07/11/16 0733  GLUCAP 108*   Lipid Profile: No results for input(s): CHOL, HDL, LDLCALC, TRIG, CHOLHDL, LDLDIRECT in the last 72 hours. Thyroid Function Tests: No results for input(s): TSH, T4TOTAL, FREET4, T3FREE, THYROIDAB in the last 72 hours. Anemia Panel: No results for input(s): VITAMINB12, FOLATE, FERRITIN, TIBC, IRON, RETICCTPCT in the last 72 hours. Sepsis Labs:  Recent Labs Lab 07/10/16 2152 07/10/16 2305 07/11/16 0434 07/11/16 0712  LATICACIDVEN 3.72* 2.49* 1.8 1.9    Recent Results (from the past 240 hour(s))  Blood Culture (routine x 2)     Status: None (Preliminary result)   Collection Time: 07/10/16  9:00 PM  Result Value Ref Range Status   Specimen Description BLOOD LEFT ARM  Final   Special Requests IN PEDIATRIC BOTTLE 1CC  Final   Culture NO GROWTH 3 DAYS  Final   Report Status PENDING  Incomplete  Blood Culture (routine x 2)     Status: Abnormal   Collection Time: 07/10/16  9:40 PM  Result Value Ref Range Status   Specimen Description BLOOD LEFT FOOT  Final     Special Requests BOTTLES DRAWN AEROBIC ONLY 5CC  Final   Culture  Setup Time   Final    GRAM NEGATIVE RODS AEROBIC BOTTLE ONLY CRITICAL RESULT CALLED TO, READ BACK BY AND VERIFIED WITH: Artelia Laroche Pharm.D. 14:50 07/11/16 ([email or web address])    Culture ESCHERICHIA COLI (A)  Final   Report Status 07/13/2016 FINAL  Final   Organism ID, Bacteria ESCHERICHIA COLI  Final      Susceptibility   Escherichia coli - MIC*    AMPICILLIN >=32 RESISTANT Resistant     CEFAZOLIN <=4 SENSITIVE Sensitive     CEFEPIME <=1 SENSITIVE Sensitive     CEFTAZIDIME <=1 SENSITIVE Sensitive     CEFTRIAXONE <=1 SENSITIVE Sensitive     CIPROFLOXACIN >=4 RESISTANT Resistant     GENTAMICIN <=1 SENSITIVE Sensitive     IMIPENEM <=0.25 SENSITIVE Sensitive     TRIMETH/SULFA >=320 RESISTANT Resistant     AMPICILLIN/SULBACTAM >=32 RESISTANT Resistant     PIP/TAZO <=4 SENSITIVE Sensitive     Extended ESBL NEGATIVE Sensitive     * ESCHERICHIA COLI  Blood Culture ID Panel (Reflexed)     Status: Abnormal   Collection Time: 07/10/16  9:40 PM  Result Value Ref Range Status   Enterococcus species NOT DETECTED NOT DETECTED Final   Listeria monocytogenes NOT DETECTED NOT DETECTED Final   Staphylococcus species NOT DETECTED NOT DETECTED Final   Staphylococcus aureus NOT DETECTED NOT DETECTED Final   Streptococcus species NOT DETECTED NOT DETECTED Final   Streptococcus agalactiae NOT DETECTED NOT DETECTED Final   Streptococcus pneumoniae NOT DETECTED NOT DETECTED Final   Streptococcus pyogenes NOT DETECTED NOT DETECTED Final   Acinetobacter baumannii NOT DETECTED NOT DETECTED Final   Enterobacteriaceae species DETECTED (A) NOT DETECTED Final    Comment: Enterobacteriaceae represent a large family of gram-negative bacteria, not a single organism. CRITICAL RESULT CALLED TO, READ BACK BY AND VERIFIED WITH: M. Mayford Knife Pharm.D. 14:50 07/11/16 ([email or web address])    Enterobacter cloacae complex NOT DETECTED NOT DETECTED Final   Escherichia coli  DETECTED (A) NOT DETECTED Final  Comment: CRITICAL RESULT CALLED TO, READ BACK BY AND VERIFIED WITH: M. Mayford Knifeurner Pharm.D. 14:50 07/11/16 ([email or web address])    Klebsiella oxytoca NOT DETECTED NOT DETECTED Final   Klebsiella pneumoniae NOT DETECTED NOT DETECTED Final   Proteus species NOT DETECTED NOT DETECTED Final   Serratia marcescens NOT DETECTED NOT DETECTED Final   Carbapenem resistance NOT DETECTED NOT DETECTED Final   Haemophilus influenzae NOT DETECTED NOT DETECTED Final   Neisseria meningitidis NOT DETECTED NOT DETECTED Final   Pseudomonas aeruginosa NOT DETECTED NOT DETECTED Final   Candida albicans NOT DETECTED NOT DETECTED Final   Candida glabrata NOT DETECTED NOT DETECTED Final   Candida krusei NOT DETECTED NOT DETECTED Final   Candida parapsilosis NOT DETECTED NOT DETECTED Final   Candida tropicalis NOT DETECTED NOT DETECTED Final  MRSA PCR Screening     Status: None   Collection Time: 07/11/16  6:00 AM  Result Value Ref Range Status   MRSA by PCR NEGATIVE NEGATIVE Final    Comment:        The GeneXpert MRSA Assay (FDA approved for NASAL specimens only), is one component of a comprehensive MRSA colonization surveillance program. It is not intended to diagnose MRSA infection nor to guide or monitor treatment for MRSA infections.   Culture, Urine     Status: Abnormal   Collection Time: 07/12/16 10:23 AM  Result Value Ref Range Status   Specimen Description URINE, RANDOM  Final   Special Requests NONE  Final   Culture <10,000 COLONIES/mL INSIGNIFICANT GROWTH (A)  Final   Report Status 07/13/2016 FINAL  Final     Radiology Studies: Dg Chest Port 1 View  Result Date: 07/13/2016 CLINICAL DATA:  Shortness of breath EXAM: PORTABLE CHEST 1 VIEW COMPARISON:  07/10/2016 FINDINGS: Grossly unchanged cardiac silhouette and mediastinal contours with atherosclerotic plaque within the thoracic aorta. Stable sequela of trans aortic valve replacement. Interval placement of right upper  extremity approach PICC line with tip projected over the superior cavoatrial junction. Pulmonary vasculature is less distinct than present examination. Worsening ill-defined heterogeneous interstitial and airspace opacities involving the majority of the right lung as well as the left lower/retrocardiac lung. Interval development of small bilateral effusions. Worsening left basilar/retrocardiac opacities. No pneumothorax. No acute osseus abnormalities. IMPRESSION: 1. Findings worrisome for extensive multifocal infection, right greater than left, though note, asymmetric edema could have have a similar appearance. 2. Interval development small bilateral effusions. 3. Right upper extremity PICC line tip projects over the superior cavoatrial junction. Electronically Signed   By: Simonne ComeJohn  Watts M.D.   On: 07/13/2016 09:48    Scheduled Meds: . aspirin EC  81 mg Oral Daily  . azithromycin  500 mg Oral Daily  . cefTRIAXone (ROCEPHIN)  IV  2 g Intravenous Q24H  . donepezil  5 mg Oral QHS  . finasteride  5 mg Oral Daily  . furosemide  40 mg Intravenous BID  . metoprolol tartrate  25 mg Oral BID  . pravastatin  40 mg Oral q1800  . sodium chloride flush  10-40 mL Intracatheter Q12H  . tamsulosin  0.4 mg Oral QPC supper   Continuous Infusions:    LOS: 3 days   Arlean Thies, Scheryl MartenSTEPHEN K, MD [facility] Pager [phone]  If 7PM-7AM, please contact night-coverage [email or web address] Password [email or web address] 07/13/2016, 3:52 PM

## 2016-07-14 LAB — CBC
HEMATOCRIT: 35.9 % — AB (ref 39.0–52.0)
Hemoglobin: 11.9 g/dL — ABNORMAL LOW (ref 13.0–17.0)
MCH: 31.5 pg (ref 26.0–34.0)
MCHC: 33.1 g/dL (ref 30.0–36.0)
MCV: 95 fL (ref 78.0–100.0)
Platelets: 105 10*3/uL — ABNORMAL LOW (ref 150–400)
RBC: 3.78 MIL/uL — AB (ref 4.22–5.81)
RDW: 14.4 % (ref 11.5–15.5)
WBC: 7.7 10*3/uL (ref 4.0–10.5)

## 2016-07-14 LAB — BASIC METABOLIC PANEL
Anion gap: 7 (ref 5–15)
BUN: 20 mg/dL (ref 6–20)
CHLORIDE: 106 mmol/L (ref 101–111)
CO2: 29 mmol/L (ref 22–32)
Calcium: 7.9 mg/dL — ABNORMAL LOW (ref 8.9–10.3)
Creatinine, Ser: 0.91 mg/dL (ref 0.61–1.24)
GFR calc Af Amer: >60 mL/min (ref >60)
GFR calc non Af Amer: >60 mL/min (ref >60)
Glucose, Bld: 93 mg/dL (ref 65–99)
POTASSIUM: 2.9 mmol/L — AB (ref 3.5–5.1)
Sodium: 142 mmol/L (ref 135–145)

## 2016-07-14 MED ORDER — POTASSIUM CHLORIDE CRYS ER 20 MEQ PO TBCR
40.0000 meq | EXTENDED_RELEASE_TABLET | Freq: Two times a day (BID) | ORAL | Status: AC
  Administered 2016-07-14 (×2): 40 meq via ORAL
  Filled 2016-07-14 (×2): qty 2

## 2016-07-14 NOTE — Progress Notes (Signed)
Patient up all night. Restless in his bed trying to removed mittens off. Mittens off (trial) x 1 hoping he would settle  down but with no apparent change.

## 2016-07-14 NOTE — Progress Notes (Signed)
PROGRESS NOTE    Max Martinez  WUJ:[id number] DOB: 20-Jan-1925 DOA: 07/10/2016 PCP: Pcp Not In System    Brief Narrative:  83 y.o. male with a past medical history significant for dementia, HTN, CAD, AS s/p TAVR in 2016, Afib not on anticoagulation and CHF EF 45% who presents with fever, cough, vomiting for 1 day. Patient was found to have R basilar PNA with sepsis and was admitted for further work up..  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 Pneumonia with UTI and ecoli bactermia with sepsis present on admission:  - CXR with findings of haziness at R lung base that may be scarring from prior infection vs recurrent PNA - Patient meets criteria given tachycardia, tachypnea, fever, and evidence of organ dysfunction. - Pt noted to have ecoli bacteremia that is cipro, ampicillin, and bactrim resistant. UA suggestive of possible UTI   - Urine cx with insignificant growth, although urine cx was obtained after broad abx were already started - Pt now on azithromycin and rocephin - Pt currently remains afebrile. Not tachycardic  2. Possible acute on Chronic systolic and diastolic CHF, in setting of AS s/p TAVR:  - Initially hypovolemic in setting of sepsis.  -Patient with increase O2 requirements which is a new finding -Recent CXR with findings of likely asymmetric pulm edema per my read - Pt has improved with scheduled IV lasix. Will continue - Repeat bmet in AM  3. Hypertension and coronary secondary prevention:  - Pt noted to be hypotensive at admission -Continue  aspirin, statin as tolerated - Pt normotensive currently. Continue metoprolol per home regimen. Lasix continued per above  4. Atrial fibrillation:  - CHADS2Vasc 5, not on warfarin, presumably because of age, frailty.   - Have resumed metoprolol per above -Will continue tele and monitor - Rate controlled at present  5. Dementia:  -Will continue donepezil for now -Plan to continue to avoid sedating medicaitons if possible  6. Other medications:  -Plan to continue Flomax and Proscar for now  7. Thrombocytopenia: -Plts are slowly improving - Suspecting secondary to presenting sepsis - Will repeat cbc in AM  8. 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 Recommendations appreciated. - When pt is more stable, O2 requirements improved, would plan on CTA abd/pelvis with bilateral run-off as recommended  DVT prophylaxis: SCD's Code Status: DNR Family Communication: Pt in room, family not at bedside Disposition Plan: Uncertain at this time  Consultants:   Discussed case with Vascular Surgery over phone  Procedures:     Antimicrobials: Anti-infectives    Start     Dose/Rate Route Frequency Ordered Stop   07/13/16 2200  azithromycin (ZITHROMAX) tablet 500 mg     500 mg Oral Daily 07/13/16 0720     07/11/16 2200  azithromycin (ZITHROMAX) 500 mg in dextrose 5 % 250 mL IVPB  Status:  Discontinued     500 mg 250 mL/hr over 60 Minutes Intravenous Every 24  hours 07/11/16 0007 07/13/16 0720   07/11/16 2000  cefTRIAXone (ROCEPHIN) 1 g in dextrose 5 % 50 mL IVPB  Status:  Discontinued     1 g 100 mL/hr over 30 Minutes Intravenous Every 24 hours 07/11/16 0007 07/11/16 1500   07/11/16 1600  cefTRIAXone (ROCEPHIN) 2 g in dextrose 5 % 50 mL IVPB     2 g 100 mL/hr over 30 Minutes Intravenous Every 24 hours  07/11/16 1500     07/10/16 2100  cefTRIAXone (ROCEPHIN) 1 g in dextrose 5 % 50 mL IVPB     1 g 100 mL/hr over 30 Minutes Intravenous  Once 07/10/16 2054 07/10/16 2244   07/10/16 2100  azithromycin (ZITHROMAX) 500 mg in dextrose 5 % 250 mL IVPB     500 mg 250 mL/hr over 60 Minutes Intravenous  Once 07/10/16 2054 07/10/16 2315      Subjective: Reports feeling better  Objective: Vitals:   07/13/16 1752 07/13/16 2151 07/14/16 0619 07/14/16 0845  BP: (!) 148/79 140/88 134/60 125/70  Pulse: 64 95 63 72  Resp: 20 20 19 20   Temp: 98 F (36.7 C) 98.3 F (36.8 C) 97.8 F (36.6 C) 97.4 F (36.3 C)  TempSrc: Oral Oral Oral Oral  SpO2: 98% 97% 98% 98%  Weight:  74.9 kg (165 lb 1.6 oz)    Height:        Intake/Output Summary (Last 24 hours) at 07/14/16 1743 Last data filed at 07/14/16 1500  Gross per 24 hour  Intake              770 ml  Output             3127 ml  Net            -2357 ml   Filed Weights   07/11/16 0600 07/12/16 2114 07/13/16 2151  Weight: 70.8 kg (156 lb) 76.1 kg (167 lb 11.2 oz) 74.9 kg (165 lb 1.6 oz)    Examination:  General exam: Awake, in nad, conversant Respiratory system: normal resp effort, no audible wheezing Cardiovascular system: regular rate, s1-2 Gastrointestinal system: soft, pos BS, nondistended Central nervous system: cn2-12 grossly intact, strength intact Extremities: no clubbing, no joint deformities Skin: normal skin turgor, no notable skin lesions seen Psychiatry: affect appears normal// no visual hallucinations  Data Reviewed: I have personally reviewed following labs and imaging studies  CBC:  Recent Labs Lab 07/10/16 2131 07/11/16 0434 07/12/16 0520 07/13/16 0440 07/14/16 0352  WBC 4.4 9.0 8.0 8.0 7.7  NEUTROABS 4.1  --   --   --   --   HGB 13.9 11.5* 12.1* 11.5* 11.9*  HCT 41.9 35.6* 37.1* 35.3* 35.9*  MCV 95.7 95.4 95.4 95.7 95.0  PLT 93* 83* 95* 103* 105*   Basic Metabolic Panel:  Recent Labs Lab 07/10/16 2057  07/11/16 0434 07/12/16 0520 07/13/16 0440 07/14/16 0352  NA 141 140 140 140 142  K 3.8 3.2* 3.6 3.9 2.9*  CL 104 109 108 109 106  CO2 21* 20* 21* 24 29  GLUCOSE 99 117* 104* 101* 93  BUN 18 18 22* 18 20  CREATININE 1.19 1.15 0.93 0.77 0.91  CALCIUM 8.5* 7.1* 7.6* 7.6* 7.9*   GFR: Estimated Creatinine Clearance: 51.5 mL/min (by C-G formula based on SCr of 0.91 mg/dL). Liver Function Tests:  Recent Labs Lab 07/10/16 2057 07/11/16 0434  AST 41 30  ALT 22 18  ALKPHOS 53 39  BILITOT 0.9 0.7  PROT 6.6 5.6*  ALBUMIN  3.6 2.7*   No results for input(s): LIPASE, AMYLASE in the last 168 hours. No results for input(s): AMMONIA in the last 168 hours. Coagulation Profile: No results for input(s): INR, PROTIME in the last 168 hours. Cardiac Enzymes: No results for input(s): CKTOTAL, CKMB, CKMBINDEX, TROPONINI in the last 168 hours. BNP (last 3 results) No results for input(s): PROBNP in the last 8760 hours. HbA1C: No results for input(s): HGBA1C in the last 72 hours. CBG:  Recent Labs Lab 07/11/16 0733 07/13/16 2147  GLUCAP 108* 129*   Lipid Profile: No results for input(s): CHOL, HDL, LDLCALC, TRIG, CHOLHDL, LDLDIRECT in the last 72 hours. Thyroid Function Tests: No results for input(s): TSH, T4TOTAL, FREET4, T3FREE, THYROIDAB in the last 72 hours. Anemia Panel: No results for input(s): VITAMINB12, FOLATE, FERRITIN, TIBC, IRON, RETICCTPCT in the last 72 hours. Sepsis Labs:  Recent Labs Lab 07/10/16 2152 07/10/16 2305 07/11/16 0434 07/11/16 0712  LATICACIDVEN 3.72* 2.49* 1.8 1.9    Recent Results (from the past 240 hour(s))  Blood Culture (routine x 2)     Status: None (Preliminary result)   Collection Time: 07/10/16  9:00 PM  Result Value Ref Range Status   Specimen Description BLOOD LEFT ARM  Final   Special Requests IN PEDIATRIC BOTTLE 1CC  Final   Culture NO GROWTH 4 DAYS  Final   Report Status PENDING  Incomplete  Blood Culture (routine x 2)     Status:  Abnormal   Collection Time: 07/10/16  9:40 PM  Result Value Ref Range Status   Specimen Description BLOOD LEFT FOOT  Final   Special Requests BOTTLES DRAWN AEROBIC ONLY 5CC  Final   Culture  Setup Time   Final    GRAM NEGATIVE RODS AEROBIC BOTTLE ONLY CRITICAL RESULT CALLED TO, READ BACK BY AND VERIFIED WITH: Artelia Laroche Pharm.D. 14:50 07/11/16 ([email or web address])    Culture ESCHERICHIA COLI (A)  Final   Report Status 07/13/2016 FINAL  Final   Organism ID, Bacteria ESCHERICHIA COLI  Final      Susceptibility   Escherichia coli - MIC*    AMPICILLIN >=32 RESISTANT Resistant     CEFAZOLIN <=4 SENSITIVE Sensitive     CEFEPIME <=1 SENSITIVE Sensitive     CEFTAZIDIME <=1 SENSITIVE Sensitive     CEFTRIAXONE <=1 SENSITIVE Sensitive     CIPROFLOXACIN >=4 RESISTANT Resistant     GENTAMICIN <=1 SENSITIVE Sensitive     IMIPENEM <=0.25 SENSITIVE Sensitive     TRIMETH/SULFA >=320 RESISTANT Resistant     AMPICILLIN/SULBACTAM >=32 RESISTANT Resistant     PIP/TAZO <=4 SENSITIVE Sensitive     Extended ESBL NEGATIVE Sensitive     * ESCHERICHIA COLI  Blood Culture ID Panel (Reflexed)     Status: Abnormal   Collection Time: 07/10/16  9:40 PM  Result Value Ref Range Status   Enterococcus species NOT DETECTED NOT DETECTED Final   Listeria monocytogenes NOT DETECTED NOT DETECTED Final   Staphylococcus species NOT DETECTED NOT DETECTED Final   Staphylococcus aureus NOT DETECTED NOT DETECTED Final   Streptococcus species NOT DETECTED NOT DETECTED Final   Streptococcus agalactiae NOT DETECTED NOT DETECTED Final   Streptococcus pneumoniae NOT DETECTED NOT DETECTED Final   Streptococcus pyogenes NOT DETECTED NOT DETECTED Final   Acinetobacter baumannii NOT DETECTED NOT DETECTED Final   Enterobacteriaceae species DETECTED (A) NOT DETECTED Final    Comment: Enterobacteriaceae represent a large family of gram-negative bacteria, not a single organism. CRITICAL RESULT CALLED TO, READ BACK BY AND VERIFIED WITH:  Artelia Laroche  Pharm.D. 14:50 07/11/16 ([email or web address])    Enterobacter cloacae complex NOT DETECTED NOT DETECTED Final   Escherichia coli DETECTED (A) NOT DETECTED Final    Comment: CRITICAL RESULT CALLED TO, READ BACK BY AND VERIFIED WITH: M. Mayford Knife Pharm.D. 14:50 07/11/16 ([email or web address])    Klebsiella oxytoca NOT DETECTED NOT DETECTED Final   Klebsiella pneumoniae NOT DETECTED NOT DETECTED Final   Proteus species NOT DETECTED NOT DETECTED Final   Serratia marcescens NOT DETECTED NOT DETECTED Final   Carbapenem resistance NOT DETECTED NOT DETECTED Final   Haemophilus influenzae NOT DETECTED NOT DETECTED Final   Neisseria meningitidis NOT DETECTED NOT DETECTED Final   Pseudomonas aeruginosa NOT DETECTED NOT DETECTED Final   Candida albicans NOT DETECTED NOT DETECTED Final   Candida glabrata NOT DETECTED NOT DETECTED Final   Candida krusei NOT DETECTED NOT DETECTED Final   Candida parapsilosis NOT DETECTED NOT DETECTED Final   Candida tropicalis NOT DETECTED NOT DETECTED Final  MRSA PCR Screening     Status: None   Collection Time: 07/11/16  6:00 AM  Result Value Ref Range Status   MRSA by PCR NEGATIVE NEGATIVE Final    Comment:        The GeneXpert MRSA Assay (FDA approved for NASAL specimens only), is one component of a comprehensive MRSA colonization surveillance program. It is not intended to diagnose MRSA infection nor to guide or monitor treatment for MRSA infections.   Culture, Urine     Status: Abnormal   Collection Time: 07/12/16 10:23 AM  Result Value Ref Range Status   Specimen Description URINE, RANDOM  Final   Special Requests NONE  Final   Culture <10,000 COLONIES/mL INSIGNIFICANT GROWTH (A)  Final   Report Status 07/13/2016 FINAL  Final     Radiology Studies: Dg Chest Port 1 View  Result Date: 07/13/2016 CLINICAL DATA:  Shortness of breath EXAM: PORTABLE CHEST 1 VIEW COMPARISON:  07/10/2016 FINDINGS: Grossly unchanged cardiac silhouette and mediastinal contours with atherosclerotic  plaque within the thoracic aorta. Stable sequela of trans aortic valve replacement. Interval placement of right upper extremity approach PICC line with tip projected over the superior cavoatrial junction. Pulmonary vasculature is less distinct than present examination. Worsening ill-defined heterogeneous interstitial and airspace opacities involving the majority of the right lung as well as the left lower/retrocardiac lung. Interval development of small bilateral effusions. Worsening left basilar/retrocardiac opacities. No pneumothorax. No acute osseus abnormalities. IMPRESSION: 1. Findings worrisome for extensive multifocal infection, right greater than left, though note, asymmetric edema could have have a similar appearance. 2. Interval development small bilateral effusions. 3. Right upper extremity PICC line tip projects over the superior cavoatrial junction. Electronically Signed   By: Simonne Come M.D.   On: 07/13/2016 09:48    Scheduled Meds: . aspirin EC  81 mg Oral Daily  . azithromycin  500 mg Oral Daily  . cefTRIAXone (ROCEPHIN)  IV  2 g Intravenous Q24H  . donepezil  5 mg Oral QHS  . finasteride  5 mg Oral Daily  . furosemide  40 mg Intravenous BID  . mouth rinse  15 mL Mouth Rinse BID  . metoprolol tartrate  25 mg Oral BID  . potassium chloride  40 mEq Oral BID WC  . pravastatin  40 mg Oral q1800  . sodium chloride flush  10-40 mL Intracatheter Q12H  . tamsulosin  0.4 mg Oral QPC supper   Continuous Infusions:    LOS: 4 days   Hilda Wexler, Scheryl Marten, MD [facility]  Pager [phone]  If 7PM-7AM, please contact night-coverage [email or web address] Password [facility] 07/14/2016, 5:43 PM

## 2016-07-15 ENCOUNTER — Inpatient Hospital Stay (HOSPITAL_COMMUNITY): Payer: Medicare HMO

## 2016-07-15 LAB — MAGNESIUM: Magnesium: 1.7 mg/dL (ref 1.7–2.4)

## 2016-07-15 LAB — BASIC METABOLIC PANEL WITH GFR
Anion gap: 11 (ref 5–15)
BUN: 23 mg/dL — ABNORMAL HIGH (ref 6–20)
CO2: 28 mmol/L (ref 22–32)
Calcium: 8.1 mg/dL — ABNORMAL LOW (ref 8.9–10.3)
Chloride: 105 mmol/L (ref 101–111)
Creatinine, Ser: 0.99 mg/dL (ref 0.61–1.24)
GFR calc Af Amer: >60 mL/min
GFR calc non Af Amer: >60 mL/min
Glucose, Bld: 104 mg/dL — ABNORMAL HIGH (ref 65–99)
Potassium: 3.3 mmol/L — ABNORMAL LOW (ref 3.5–5.1)
Sodium: 144 mmol/L (ref 135–145)

## 2016-07-15 LAB — CBC
HCT: 37.4 % — ABNORMAL LOW (ref 39.0–52.0)
Hemoglobin: 12.4 g/dL — ABNORMAL LOW (ref 13.0–17.0)
MCH: 31 pg (ref 26.0–34.0)
MCHC: 33.2 g/dL (ref 30.0–36.0)
MCV: 93.5 fL (ref 78.0–100.0)
Platelets: 109 K/uL — ABNORMAL LOW (ref 150–400)
RBC: 4 MIL/uL — ABNORMAL LOW (ref 4.22–5.81)
RDW: 13.8 % (ref 11.5–15.5)
WBC: 7.5 K/uL (ref 4.0–10.5)

## 2016-07-15 LAB — CULTURE, BLOOD (ROUTINE X 2): CULTURE: NO GROWTH

## 2016-07-15 MED ORDER — CEFPODOXIME PROXETIL 200 MG PO TABS
200.0000 mg | ORAL_TABLET | Freq: Two times a day (BID) | ORAL | Status: DC
  Administered 2016-07-15 – 2016-07-17 (×4): 200 mg via ORAL
  Filled 2016-07-15 (×5): qty 1

## 2016-07-15 MED ORDER — IOPAMIDOL (ISOVUE-370) INJECTION 76%
INTRAVENOUS | Status: AC
  Administered 2016-07-15: 100 mL
  Filled 2016-07-15: qty 100

## 2016-07-15 MED ORDER — POTASSIUM CHLORIDE CRYS ER 20 MEQ PO TBCR
40.0000 meq | EXTENDED_RELEASE_TABLET | Freq: Two times a day (BID) | ORAL | Status: AC
  Administered 2016-07-15 (×2): 40 meq via ORAL
  Filled 2016-07-15 (×2): qty 2

## 2016-07-15 MED ORDER — BENZONATATE 100 MG PO CAPS: 100.0000 mg | ORAL_CAPSULE | Freq: Three times a day (TID) | ORAL | Status: DC | PRN

## 2016-07-15 NOTE — Care Management Important Message (Signed)
Important Message  Patient Details  Name: Max Martinez MRN: [id number] Date of Birth: 06-09-1924   Medicare Important Message Given:  Yes    [name] 07/15/2016, 12:23 PM

## 2016-07-15 NOTE — Progress Notes (Addendum)
PROGRESS NOTE    Max Martinez  WUJ:[id number] DOB: 30-Sep-1924 DOA: 07/10/2016 PCP: Pcp Not In System    Brief Narrative:  83 y.o. male with a past medical history significant for dementia, HTN, CAD, AS s/p TAVR in 2016, Afib not on anticoagulation and CHF EF 45% who presents with fever, cough, vomiting for 1 day. Patient was found to have R basilar PNA with sepsis and was admitted for further work up..  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 Pneumonia with UTI and ecoli bactermia with sepsis present on admission:  - CXR with findings of haziness at R lung base that may be scarring from prior infection vs recurrent PNA - Patient meets criteria given tachycardia, tachypnea, fever, and evidence of organ dysfunction. - Pt noted to have ecoli bacteremia that is cipro, ampicillin, and bactrim resistant. UA suggestive of possible UTI   - Urine cx with insignificant growth, although urine cx was obtained after broad abx were already started - Pt had been continued on azithromycin and rocephin - Pt currently remains afebrile. Not tachycardic - Clinically improved. Transition to PO abx to complete course  2. Possible acute on Chronic systolic and diastolic CHF, in setting of AS s/p TAVR:  - Initially hypovolemic in setting of sepsis.  -Patient with increase O2 requirements which is a new finding -Recent CXR with findings of likely asymmetric pulm edema per my read - Pt has shown much improvement with scheduled IV lasix. Will continue - Recheck bmet in AM  3.  Hypertension and coronary secondary prevention:  - Pt noted to be hypotensive at admission -Will continue aspirin, statin as tolerated - Pt normotensive currently. Plan to continue metoprolol per home regimen. Lasix continued per above  4. Atrial fibrillation:  - CHADS2Vasc 5, not on warfarin, presumably because of age, frailty.   - Have resumed metoprolol per above -Plan to continue tele and monitor - Rate controlled at present  5. Dementia:  -Will continue donepezil for now -Will avoid sedating medicaitons if possible  6. Other medications:  -Plan to continue Flomax and Proscar for now  7. Thrombocytopenia: -Plts are slowly improving - Suspecting secondary to presenting sepsis - Recheck cbc in AM  8. 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 Recommendations appreciated. - As pt is more stable and O2 requirements improved, will plan on CTA abd/pelvis with bilateral run-off as recommended. Will order study  DVT prophylaxis: SCD's Code Status: DNR Family Communication: Pt in room, updated daughter over phone Disposition Plan: Uncertain at this time  Consultants:   Discussed case with Vascular Surgery over phone  Procedures:     Antimicrobials: Anti-infectives    Start     Dose/Rate Route Frequency Ordered Stop   07/15/16 2200  cefpodoxime (VANTIN) tablet 200 mg     200 mg Oral Every 12 hours 07/15/16 1553     07/13/16 2200  azithromycin (ZITHROMAX) tablet 500 mg  500 mg Oral Daily 07/13/16 0720     07/11/16 2200  azithromycin (ZITHROMAX) 500 mg in dextrose 5 % 250 mL IVPB  Status:  Discontinued     500 mg 250 mL/hr over 60 Minutes Intravenous Every 24 hours 07/11/16 0007 07/13/16 0720   07/11/16 2000  cefTRIAXone (ROCEPHIN) 1 g in dextrose 5 % 50 mL IVPB  Status:   Discontinued     1 g 100 mL/hr over 30 Minutes Intravenous Every 24 hours 07/11/16 0007 07/11/16 1500   07/11/16 1600  cefTRIAXone (ROCEPHIN) 2 g in dextrose 5 % 50 mL IVPB  Status:  Discontinued     2 g 100 mL/hr over 30 Minutes Intravenous Every 24 hours 07/11/16 1500 07/15/16 1553   07/10/16 2100  cefTRIAXone (ROCEPHIN) 1 g in dextrose 5 % 50 mL IVPB     1 g 100 mL/hr over 30 Minutes Intravenous  Once 07/10/16 2054 07/10/16 2244   07/10/16 2100  azithromycin (ZITHROMAX) 500 mg in dextrose 5 % 250 mL IVPB     500 mg 250 mL/hr over 60 Minutes Intravenous  Once 07/10/16 2054 07/10/16 2315      Subjective: Complains of coughing  Objective: Vitals:   07/14/16 0845 07/14/16 2036 07/15/16 0632 07/15/16 1036  BP: 125/70 139/82 130/75 (!) 115/44  Pulse: 72 72 97 67  Resp: 20 20 19 18   Temp: 97.4 F (36.3 C) 98.9 F (37.2 C) 98.4 F (36.9 C) 98.7 F (37.1 C)  TempSrc: Oral Oral Oral Oral  SpO2: 98% 94% 95% 91%  Weight:  76.2 kg (167 lb 15.9 oz)    Height:        Intake/Output Summary (Last 24 hours) at 07/15/16 1600 Last data filed at 07/15/16 1400  Gross per 24 hour  Intake             1080 ml  Output             2600 ml  Net            -1520 ml   Filed Weights   07/12/16 2114 07/13/16 2151 07/14/16 2036  Weight: 76.1 kg (167 lb 11.2 oz) 74.9 kg (165 lb 1.6 oz) 76.2 kg (167 lb 15.9 oz)    Examination:  General exam: laying in bed, awake, eating breakfast, in nad Respiratory system: normal chest rise, no audible wheezing Cardiovascular system: regular rrhythm, s1-s2 on auscultation Gastrointestinal system: pos bs, soft, nondistended Central nervous system: no seizures, no tremors Extremities: no cyanosis, no joint deformities Skin: no pallor, no rashes Psychiatry: mood appears normal// no auditory hallucinations  Data Reviewed: I have personally reviewed following labs and imaging studies  CBC:  Recent Labs Lab 07/10/16 2131 07/11/16 0434 07/12/16 0520  07/13/16 0440 07/14/16 0352 07/15/16 0418  WBC 4.4 9.0 8.0 8.0 7.7 7.5  NEUTROABS 4.1  --   --   --   --   --   HGB 13.9 11.5* 12.1* 11.5* 11.9* 12.4*  HCT 41.9 35.6* 37.1* 35.3* 35.9* 37.4*  MCV 95.7 95.4 95.4 95.7 95.0 93.5  PLT 93* 83* 95* 103* 105* 109*   Basic Metabolic Panel:  Recent Labs Lab 07/11/16 0434 07/12/16 0520 07/13/16 0440 07/14/16 0352 07/15/16 0418 07/15/16 0830  NA 140 140 140 142 144  --   K 3.2* 3.6 3.9 2.9* 3.3*  --   CL 109 108 109 106 105  --   CO2 20* 21* 24 29 28   --   GLUCOSE 117* 104* 101* 93  104*  --   BUN 18 22* 18 20 23*  --   CREATININE 1.15 0.93 0.77 0.91 0.99  --   CALCIUM 7.1* 7.6* 7.6* 7.9* 8.1*  --   MG  --   --   --   --   --  1.7   GFR: Estimated Creatinine Clearance: 47.3 mL/min (by C-G formula based on SCr of 0.99 mg/dL). Liver Function Tests:  Recent Labs Lab 07/10/16 2057 07/11/16 0434  AST 41 30  ALT 22 18  ALKPHOS 53 39  BILITOT 0.9 0.7  PROT 6.6 5.6*  ALBUMIN 3.6 2.7*   No results for input(s): LIPASE, AMYLASE in the last 168 hours. No results for input(s): AMMONIA in the last 168 hours. Coagulation Profile: No results for input(s): INR, PROTIME in the last 168 hours. Cardiac Enzymes: No results for input(s): CKTOTAL, CKMB, CKMBINDEX, TROPONINI in the last 168 hours. BNP (last 3 results) No results for input(s): PROBNP in the last 8760 hours. HbA1C: No results for input(s): HGBA1C in the last 72 hours. CBG:  Recent Labs Lab 07/11/16 0733 07/13/16 2147  GLUCAP 108* 129*   Lipid Profile: No results for input(s): CHOL, HDL, LDLCALC, TRIG, CHOLHDL, LDLDIRECT in the last 72 hours. Thyroid Function Tests: No results for input(s): TSH, T4TOTAL, FREET4, T3FREE, THYROIDAB in the last 72 hours. Anemia Panel: No results for input(s): VITAMINB12, FOLATE, FERRITIN, TIBC, IRON, RETICCTPCT in the last 72 hours. Sepsis Labs:  Recent Labs Lab 07/10/16 2152 07/10/16 2305 07/11/16 0434 07/11/16 0712    LATICACIDVEN 3.72* 2.49* 1.8 1.9    Recent Results (from the past 240 hour(s))  Blood Culture (routine x 2)     Status: None   Collection Time: 07/10/16  9:00 PM  Result Value Ref Range Status   Specimen Description BLOOD LEFT ARM  Final   Special Requests IN PEDIATRIC BOTTLE 1CC  Final   Culture NO GROWTH 5 DAYS  Final   Report Status 07/15/2016 FINAL  Final  Blood Culture (routine x 2)     Status: Abnormal   Collection Time: 07/10/16  9:40 PM  Result Value Ref Range Status   Specimen Description BLOOD LEFT FOOT  Final   Special Requests BOTTLES DRAWN AEROBIC ONLY 5CC  Final   Culture  Setup Time   Final    GRAM NEGATIVE RODS AEROBIC BOTTLE ONLY CRITICAL RESULT CALLED TO, READ BACK BY AND VERIFIED WITH: Artelia Laroche Pharm.D. 14:50 07/11/16 ([email or web address])    Culture ESCHERICHIA COLI (A)  Final   Report Status 07/13/2016 FINAL  Final   Organism ID, Bacteria ESCHERICHIA COLI  Final      Susceptibility   Escherichia coli - MIC*    AMPICILLIN >=32 RESISTANT Resistant     CEFAZOLIN <=4 SENSITIVE Sensitive     CEFEPIME <=1 SENSITIVE Sensitive     CEFTAZIDIME <=1 SENSITIVE Sensitive     CEFTRIAXONE <=1 SENSITIVE Sensitive     CIPROFLOXACIN >=4 RESISTANT Resistant     GENTAMICIN <=1 SENSITIVE Sensitive     IMIPENEM <=0.25 SENSITIVE Sensitive     TRIMETH/SULFA >=320 RESISTANT Resistant     AMPICILLIN/SULBACTAM >=32 RESISTANT Resistant     PIP/TAZO <=4 SENSITIVE Sensitive     Extended ESBL NEGATIVE Sensitive     * ESCHERICHIA COLI  Blood Culture ID Panel (Reflexed)     Status: Abnormal   Collection Time: 07/10/16  9:40 PM  Result Value Ref Range Status   Enterococcus species NOT DETECTED NOT DETECTED Final   Listeria monocytogenes NOT  DETECTED NOT DETECTED Final   Staphylococcus species NOT DETECTED NOT DETECTED Final   Staphylococcus aureus NOT DETECTED NOT DETECTED Final   Streptococcus species NOT DETECTED NOT DETECTED Final   Streptococcus agalactiae NOT DETECTED NOT DETECTED Final    Streptococcus pneumoniae NOT DETECTED NOT DETECTED Final   Streptococcus pyogenes NOT DETECTED NOT DETECTED Final   Acinetobacter baumannii NOT DETECTED NOT DETECTED Final   Enterobacteriaceae species DETECTED (A) NOT DETECTED Final    Comment: Enterobacteriaceae represent a large family of gram-negative bacteria, not a single organism. CRITICAL RESULT CALLED TO, READ BACK BY AND VERIFIED WITH: M. Mayford Knife Pharm.D. 14:50 07/11/16 ([email or web address])    Enterobacter cloacae complex NOT DETECTED NOT DETECTED Final   Escherichia coli DETECTED (A) NOT DETECTED Final    Comment: CRITICAL RESULT CALLED TO, READ BACK BY AND VERIFIED WITH: M. Mayford Knife Pharm.D. 14:50 07/11/16 ([email or web address])    Klebsiella oxytoca NOT DETECTED NOT DETECTED Final   Klebsiella pneumoniae NOT DETECTED NOT DETECTED Final   Proteus species NOT DETECTED NOT DETECTED Final   Serratia marcescens NOT DETECTED NOT DETECTED Final   Carbapenem resistance NOT DETECTED NOT DETECTED Final   Haemophilus influenzae NOT DETECTED NOT DETECTED Final   Neisseria meningitidis NOT DETECTED NOT DETECTED Final   Pseudomonas aeruginosa NOT DETECTED NOT DETECTED Final   Candida albicans NOT DETECTED NOT DETECTED Final   Candida glabrata NOT DETECTED NOT DETECTED Final   Candida krusei NOT DETECTED NOT DETECTED Final   Candida parapsilosis NOT DETECTED NOT DETECTED Final   Candida tropicalis NOT DETECTED NOT DETECTED Final  MRSA PCR Screening     Status: None   Collection Time: 07/11/16  6:00 AM  Result Value Ref Range Status   MRSA by PCR NEGATIVE NEGATIVE Final    Comment:        The GeneXpert MRSA Assay (FDA approved for NASAL specimens only), is one component of a comprehensive MRSA colonization surveillance program. It is not intended to diagnose MRSA infection nor to guide or monitor treatment for MRSA infections.   Culture, Urine     Status: Abnormal   Collection Time: 07/12/16 10:23 AM  Result Value Ref Range Status   Specimen  Description URINE, RANDOM  Final   Special Requests NONE  Final   Culture <10,000 COLONIES/mL INSIGNIFICANT GROWTH (A)  Final   Report Status 07/13/2016 FINAL  Final     Radiology Studies: No results found.  Scheduled Meds: . aspirin EC  81 mg Oral Daily  . azithromycin  500 mg Oral Daily  . cefpodoxime  200 mg Oral Q12H  . donepezil  5 mg Oral QHS  . finasteride  5 mg Oral Daily  . furosemide  40 mg Intravenous BID  . mouth rinse  15 mL Mouth Rinse BID  . metoprolol tartrate  25 mg Oral BID  . potassium chloride  40 mEq Oral BID WC  . pravastatin  40 mg Oral q1800  . sodium chloride flush  10-40 mL Intracatheter Q12H  . tamsulosin  0.4 mg Oral QPC supper   Continuous Infusions:    LOS: 5 days   CHIU, Scheryl Marten, MD [facility] Pager [phone]  If 7PM-7AM, please contact night-coverage [email or web address] Password [email or web address] 07/15/2016, 4:00 PM

## 2016-07-15 NOTE — Progress Notes (Signed)
Physical Therapy Treatment Patient Details Name: Max Martinez MRN: [id number] DOB: 1925-04-27 Today's Date: 07/15/2016    History of Present Illness Pt is an 83 y/o male admitted secondary to fever, cough and sepsis. PMH including but not limited to dementia, CAD, CHF, a-fib, s/p TAVR in 2016.    PT Comments    Pt is getting up to side of bed with PT and is quite unable to sequence and plan the activity.  He is willing to work but will need a lot of encouragement and cues for success.  SNF still recommended for this reason and with his very limited standing and gait tolerance.  Will continue acutely to focus on strength, ROM of LE's and endurance and balance of all standing activities.  Very willing to work and is a pleasure to get through a therapy session.   Follow Up Recommendations  SNF     Equipment Recommendations  None recommended by PT    Recommendations for Other Services       Precautions / Restrictions Precautions Precautions: Fall (telemetry) Restrictions Weight Bearing Restrictions: No    Mobility  Bed Mobility Overal bed mobility: Needs Assistance Bed Mobility: Supine to Sit     Supine to sit: Mod assist     General bed mobility comments: pt needs directions for sequencing the walker  Transfers Overall transfer level: Needs assistance Equipment used: Rolling walker (2 wheeled) Transfers: Sit to/from Stand;Stand Pivot Transfers Sit to Stand: +2 physical assistance;+2 safety/equipment;From elevated surface;Mod assist Stand pivot transfers: Mod assist;+2 physical assistance;+2 safety/equipment;From elevated surface       General transfer comment: reminders for all hand placement, sequence and initiation  Ambulation/Gait Ambulation/Gait assistance: Mod assist;+2 physical assistance;+2 safety/equipment Ambulation Distance (Feet): 6 Feet Assistive device: Rolling walker (2 wheeled) Gait Pattern/deviations: Step-through pattern;Wide base of  support;Shuffle;Decreased stride length;Step-to pattern;Trunk flexed Gait velocity: decreased Gait velocity interpretation: Below normal speed for age/gender General Gait Details: Pt is in continual cueing for sequence, for safety and for steps   Stairs            Wheelchair Mobility    Modified Rankin (Stroke Patients Only)       Balance Overall balance assessment: Needs assistance Sitting-balance support: Feet supported Sitting balance-[name]: Fair     Standing balance support: Bilateral upper extremity supported Standing balance-[name]: Poor                      Cognition Arousal/Alertness: Awake/alert Behavior During Therapy: WFL for tasks assessed/performed Overall Cognitive Status: History of cognitive impairments - at baseline Area of Impairment: Attention;Following commands;Safety/judgement Orientation Level: Situation;Time Current Attention Level: Selective Memory: Decreased recall of precautions;Decreased short-term memory Following Commands: Follows one step commands with increased time;Follows one step commands inconsistently Safety/Judgement: Decreased awareness of safety;Decreased awareness of deficits   Problem Solving: Slow processing;Decreased initiation;Difficulty sequencing;Requires verbal cues      Exercises General Exercises - Lower Extremity Ankle Circles/Pumps: AAROM;Both;10 reps Gluteal Sets: AAROM;Both;10 reps Heel Slides: AROM;Both;10 reps Hip ABduction/ADduction: AROM;Both;10 reps    General Comments        Pertinent Vitals/Pain Pain Assessment: No/denies pain    Home Living                      Prior Function            PT Goals (current goals can now be found in the care plan section) Acute Rehab PT Goals Patient Stated Goal: feel better Progress  towards PT goals: Progressing toward goals    Frequency    Min 2X/week      PT Plan Current plan remains appropriate    Co-evaluation              End of Session Equipment Utilized During Treatment: Gait belt Activity Tolerance: Patient tolerated treatment well Patient left: in chair;with call bell/phone within reach;with chair alarm set;with nursing/sitter in room Nurse Communication: Mobility status PT Visit Diagnosis: Other abnormalities of gait and mobility (R26.89)     Time: 1131-1203 PT Time Calculation (min) (ACUTE ONLY): 32 min  Charges:  $Gait Training: 8-22 mins $Therapeutic Exercise: 8-22 mins                    G Codes:  Functional Assessment Tool Used: AM-PAC 6 Clicks Basic Mobility    Ivar Drape 07/15/2016, 12:36 PM   Samul Dada, PT MS Acute Rehab Dept. Number: [facility] [id number] and [facility] [phone]

## 2016-07-16 LAB — BASIC METABOLIC PANEL
ANION GAP: 10 (ref 5–15)
BUN: 26 mg/dL — ABNORMAL HIGH (ref 6–20)
CALCIUM: 8.3 mg/dL — AB (ref 8.9–10.3)
CO2: 29 mmol/L (ref 22–32)
Chloride: 102 mmol/L (ref 101–111)
Creatinine, Ser: 1.06 mg/dL (ref 0.61–1.24)
GFR calc non Af Amer: >60 mL/min (ref >60)
Glucose, Bld: 113 mg/dL — ABNORMAL HIGH (ref 65–99)
POTASSIUM: 3.7 mmol/L (ref 3.5–5.1)
Sodium: 141 mmol/L (ref 135–145)

## 2016-07-16 LAB — CBC
HEMATOCRIT: 38.7 % — AB (ref 39.0–52.0)
HEMOGLOBIN: 12.8 g/dL — AB (ref 13.0–17.0)
MCH: 31.1 pg (ref 26.0–34.0)
MCHC: 33.1 g/dL (ref 30.0–36.0)
MCV: 94.2 fL (ref 78.0–100.0)
Platelets: 149 10*3/uL — ABNORMAL LOW (ref 150–400)
RBC: 4.11 MIL/uL — AB (ref 4.22–5.81)
RDW: 13.9 % (ref 11.5–15.5)
WBC: 8.1 10*3/uL (ref 4.0–10.5)

## 2016-07-16 MED ORDER — FLEET ENEMA 7-19 GM/118ML RE ENEM
1.0000 | ENEMA | Freq: Once | RECTAL | Status: AC
  Administered 2016-07-16: 1 via RECTAL
  Filled 2016-07-16: qty 1

## 2016-07-16 MED ORDER — KETOROLAC TROMETHAMINE 15 MG/ML IJ SOLN: 15.0000 mg | Freq: Four times a day (QID) | INTRAMUSCULAR | Status: DC | PRN

## 2016-07-16 MED ORDER — FUROSEMIDE 40 MG PO TABS
40.0000 mg | ORAL_TABLET | Freq: Every day | ORAL | Status: DC
  Administered 2016-07-16 – 2016-07-17 (×2): 40 mg via ORAL
  Filled 2016-07-16 (×2): qty 1

## 2016-07-16 NOTE — Progress Notes (Addendum)
PROGRESS NOTE    Max Martinez  ZOX:[id number] DOB: 1925-04-17 DOA: 07/10/2016 PCP: Pcp Not In System    Brief Narrative:  83 y.o. male with a past medical history significant for dementia, HTN, CAD, AS s/p TAVR in 2016, Afib not on anticoagulation and CHF EF 45% who presents with fever, cough, vomiting for 1 day. Patient was found to have R basilar PNA with sepsis and was admitted for further work up..  During this hospital course, patient initially imnproved with broad spectrum IV abx. He later became more dyspneic and hypoxic with CXR findings worrisome for acute CHF exacerbation. Patient was started on IV lasix with improvement in O2 requirements. 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 Case was discussed with Vascular Surgery who recommends outpatient follow up.  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 Pneumonia with UTI and ecoli bactermia with sepsis present on admission:  - CXR with findings of haziness at R lung base that may be scarring from prior infection vs recurrent PNA - Patient meets criteria given tachycardia, tachypnea, fever, and evidence of organ dysfunction. - Pt noted to have ecoli bacteremia that is cipro, ampicillin, and bactrim resistant. UA suggestive of possible UTI   - Urine cx with insignificant growth, although urine cx was obtained after broad abx were already started - Pt had been continued on azithromycin and rocephin - Pt currently remains afebrile. Not tachycardic - Patient has improved clinically.  Transition to PO abx to complete course  2. Possible acute on Chronic systolic and diastolic CHF, in setting of AS s/p TAVR:  - Initially hypovolemic in setting of sepsis.  -Patient with increase O2 requirements which is a new finding -Recent CXR with findings of likely asymmetric pulm edema per my read - Pt has shown much improvement with scheduled IV lasix. - Labs reviewed. CO2 now over 30 - Will d/c IV lasix and transition to PO lasix - Will repeat bmet in AM  3. Hypertension and coronary secondary prevention:  - Pt noted to be hypotensive at admission -Will continue aspirin, statin as tolerated - Pt normotensive currently. Will continue metoprolol per home regimen. - Stable at present  4. Atrial fibrillation:  - CHADS2Vasc 5, not on warfarin, presumably because of age, frailty.   - Have resumed metoprolol per above -Plan to continue tele and monitor -remains rate controlled  5. Dementia:  -Will continue donepezil for now -Will avoid sedating medicaitons if possible - Currently stable  6. Other medications:  -Will continue Flomax and Proscar for now  7. Thrombocytopenia: -Plts are slowly improving - Suspecting secondary to presenting sepsis - Repeat cbc in am  8. 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 Recommendations appreciated. - CTA abd/pelvis obtained per Vascular Surgery recs. Findings of at least 3.5cm popliteal aneurysm. Also heterogeneous density within the aneurysm suggesting wall adherent mural thrombus. -Discussed findings with Vascular Surgery, Dr. Edilia Bo,  who recommends outpatient follow up after patient's acute illness has resolved.  DVT prophylaxis: SCD's Code Status: DNR Family Communication: Pt in room, updated daughter over phone Disposition Plan:  Uncertain at this time  Consultants:   Discussed case with Vascular Surgery over phone  Procedures:     Antimicrobials: Anti-infectives    Start     Dose/Rate Route Frequency Ordered Stop   07/15/16 2200  cefpodoxime (VANTIN) tablet 200 mg     200 mg Oral Every 12 hours 07/15/16 1553     07/13/16 2200  azithromycin (ZITHROMAX) tablet 500 mg     500 mg Oral Daily 07/13/16 0720     07/11/16 2200  azithromycin (ZITHROMAX) 500 mg in dextrose 5 % 250 mL IVPB  Status:  Discontinued     500 mg 250 mL/hr over 60 Minutes Intravenous Every 24 hours 07/11/16 0007 07/13/16 0720   07/11/16 2000  cefTRIAXone (ROCEPHIN) 1 g in dextrose 5 % 50 mL IVPB  Status:  Discontinued     1 g 100 mL/hr over 30 Minutes Intravenous Every 24 hours 07/11/16 0007 07/11/16 1500   07/11/16 1600  cefTRIAXone (ROCEPHIN) 2 g in dextrose 5 % 50 mL IVPB  Status:  Discontinued     2 g 100 mL/hr over 30 Minutes Intravenous Every 24 hours 07/11/16 1500 07/15/16 1553   07/10/16 2100  cefTRIAXone (ROCEPHIN) 1 g in dextrose 5 % 50 mL IVPB     1 g 100 mL/hr over 30 Minutes Intravenous  Once 07/10/16 2054 07/10/16 2244   07/10/16 2100  azithromycin (ZITHROMAX) 500 mg in dextrose 5 % 250 mL IVPB     500 mg 250 mL/hr over 60 Minutes Intravenous  Once 07/10/16 2054 07/10/16 2315      Subjective: No complaints today  Objective: Vitals:   07/15/16 1738 07/15/16 2201 07/16/16 0525 07/16/16 0859  BP: (!) 157/72 (!) 147/85 (!) 111/43 (!) 124/50  Pulse: 63 68 63 64  Resp: 18 18 19 18   Temp: 98.8 F (37.1 C) 98.6 F (37 C) 97.9 F (36.6 C) 98.5 F (36.9 C)  TempSrc: Oral Oral Oral Oral  SpO2: 94% 96% 94% 96%  Weight:  78.1 kg (172 lb 2.9 oz)    Height:        Intake/Output Summary (Last 24 hours) at 07/16/16 1716 Last data filed at 07/16/16 1537  Gross per 24 hour  Intake              560 ml  Output              930 ml  Net             -370 ml   Filed Weights   07/13/16 2151 07/14/16 2036 07/15/16 2201    Weight: 74.9 kg (165 lb 1.6 oz) 76.2 kg (167 lb 15.9 oz) 78.1 kg (172 lb 2.9 oz)    Examination:  General exam: awake, in nad, conversant Respiratory system: normal resp effort, no wheezing Cardiovascular system: regular rate, s1, s2 Gastrointestinal system: nontender,nondistended, pos BS Central nervous system: no seizures, no tremors Extremities: no clubbing, perfused Skin: no rashes, no notable skin lesions seen Psychiatry: affect appears normal// no visual hallucinations  Data Reviewed: I have personally reviewed following labs and imaging studies  CBC:  Recent Labs Lab 07/10/16 2131  07/12/16 0520 07/13/16 0440 07/14/16 0352 07/15/16 0418 07/16/16 0403  WBC 4.4  < > 8.0 8.0 7.7 7.5 8.1  NEUTROABS 4.1  --   --   --   --   --   --  HGB 13.9  < > 12.1* 11.5* 11.9* 12.4* 12.8*  HCT 41.9  < > 37.1* 35.3* 35.9* 37.4* 38.7*  MCV 95.7  < > 95.4 95.7 95.0 93.5 94.2  PLT 93*  < > 95* 103* 105* 109* 149*  < > = values in this interval not displayed. Basic Metabolic Panel:  Recent Labs Lab 07/12/16 0520 07/13/16 0440 07/14/16 0352 07/15/16 0418 07/15/16 0830 07/16/16 0403  NA 140 140 142 144  --  141  K 3.6 3.9 2.9* 3.3*  --  3.7  CL 108 109 106 105  --  102  CO2 21* 24 29 28   --  29  GLUCOSE 104* 101* 93 104*  --  113*  BUN 22* 18 20 23*  --  26*  CREATININE 0.93 0.77 0.91 0.99  --  1.06  CALCIUM 7.6* 7.6* 7.9* 8.1*  --  8.3*  MG  --   --   --   --  1.7  --    GFR: Estimated Creatinine Clearance: 44.2 mL/min (by C-G formula based on SCr of 1.06 mg/dL). Liver Function Tests:  Recent Labs Lab 07/10/16 2057 07/11/16 0434  AST 41 30  ALT 22 18  ALKPHOS 53 39  BILITOT 0.9 0.7  PROT 6.6 5.6*  ALBUMIN 3.6 2.7*   No results for input(s): LIPASE, AMYLASE in the last 168 hours. No results for input(s): AMMONIA in the last 168 hours. Coagulation Profile: No results for input(s): INR, PROTIME in the last 168 hours. Cardiac Enzymes: No results for input(s):  CKTOTAL, CKMB, CKMBINDEX, TROPONINI in the last 168 hours. BNP (last 3 results) No results for input(s): PROBNP in the last 8760 hours. HbA1C: No results for input(s): HGBA1C in the last 72 hours. CBG:  Recent Labs Lab 07/11/16 0733 07/13/16 2147  GLUCAP 108* 129*   Lipid Profile: No results for input(s): CHOL, HDL, LDLCALC, TRIG, CHOLHDL, LDLDIRECT in the last 72 hours. Thyroid Function Tests: No results for input(s): TSH, T4TOTAL, FREET4, T3FREE, THYROIDAB in the last 72 hours. Anemia Panel: No results for input(s): VITAMINB12, FOLATE, FERRITIN, TIBC, IRON, RETICCTPCT in the last 72 hours. Sepsis Labs:  Recent Labs Lab 07/10/16 2152 07/10/16 2305 07/11/16 0434 07/11/16 0712  LATICACIDVEN 3.72* 2.49* 1.8 1.9    Recent Results (from the past 240 hour(s))  Blood Culture (routine x 2)     Status: None   Collection Time: 07/10/16  9:00 PM  Result Value Ref Range Status   Specimen Description BLOOD LEFT ARM  Final   Special Requests IN PEDIATRIC BOTTLE 1CC  Final   Culture NO GROWTH 5 DAYS  Final   Report Status 07/15/2016 FINAL  Final  Blood Culture (routine x 2)     Status: Abnormal   Collection Time: 07/10/16  9:40 PM  Result Value Ref Range Status   Specimen Description BLOOD LEFT FOOT  Final   Special Requests BOTTLES DRAWN AEROBIC ONLY 5CC  Final   Culture  Setup Time   Final    GRAM NEGATIVE RODS AEROBIC BOTTLE ONLY CRITICAL RESULT CALLED TO, READ BACK BY AND VERIFIED WITH: Artelia Laroche Pharm.D. 14:50 07/11/16 ([email or web address])    Culture ESCHERICHIA COLI (A)  Final   Report Status 07/13/2016 FINAL  Final   Organism ID, Bacteria ESCHERICHIA COLI  Final      Susceptibility   Escherichia coli - MIC*    AMPICILLIN >=32 RESISTANT Resistant     CEFAZOLIN <=4 SENSITIVE Sensitive     CEFEPIME <=1 SENSITIVE Sensitive  CEFTAZIDIME <=1 SENSITIVE Sensitive     CEFTRIAXONE <=1 SENSITIVE Sensitive     CIPROFLOXACIN >=4 RESISTANT Resistant     GENTAMICIN <=1 SENSITIVE Sensitive      IMIPENEM <=0.25 SENSITIVE Sensitive     TRIMETH/SULFA >=320 RESISTANT Resistant     AMPICILLIN/SULBACTAM >=32 RESISTANT Resistant     PIP/TAZO <=4 SENSITIVE Sensitive     Extended ESBL NEGATIVE Sensitive     * ESCHERICHIA COLI  Blood Culture ID Panel (Reflexed)     Status: Abnormal   Collection Time: 07/10/16  9:40 PM  Result Value Ref Range Status   Enterococcus species NOT DETECTED NOT DETECTED Final   Listeria monocytogenes NOT DETECTED NOT DETECTED Final   Staphylococcus species NOT DETECTED NOT DETECTED Final   Staphylococcus aureus NOT DETECTED NOT DETECTED Final   Streptococcus species NOT DETECTED NOT DETECTED Final   Streptococcus agalactiae NOT DETECTED NOT DETECTED Final   Streptococcus pneumoniae NOT DETECTED NOT DETECTED Final   Streptococcus pyogenes NOT DETECTED NOT DETECTED Final   Acinetobacter baumannii NOT DETECTED NOT DETECTED Final   Enterobacteriaceae species DETECTED (A) NOT DETECTED Final    Comment: Enterobacteriaceae represent a large family of gram-negative bacteria, not a single organism. CRITICAL RESULT CALLED TO, READ BACK BY AND VERIFIED WITH: M. Mayford Knife Pharm.D. 14:50 07/11/16 ([email or web address])    Enterobacter cloacae complex NOT DETECTED NOT DETECTED Final   Escherichia coli DETECTED (A) NOT DETECTED Final    Comment: CRITICAL RESULT CALLED TO, READ BACK BY AND VERIFIED WITH: M. Mayford Knife Pharm.D. 14:50 07/11/16 ([email or web address])    Klebsiella oxytoca NOT DETECTED NOT DETECTED Final   Klebsiella pneumoniae NOT DETECTED NOT DETECTED Final   Proteus species NOT DETECTED NOT DETECTED Final   Serratia marcescens NOT DETECTED NOT DETECTED Final   Carbapenem resistance NOT DETECTED NOT DETECTED Final   Haemophilus influenzae NOT DETECTED NOT DETECTED Final   Neisseria meningitidis NOT DETECTED NOT DETECTED Final   Pseudomonas aeruginosa NOT DETECTED NOT DETECTED Final   Candida albicans NOT DETECTED NOT DETECTED Final   Candida glabrata NOT DETECTED NOT DETECTED Final     Candida krusei NOT DETECTED NOT DETECTED Final   Candida parapsilosis NOT DETECTED NOT DETECTED Final   Candida tropicalis NOT DETECTED NOT DETECTED Final  MRSA PCR Screening     Status: None   Collection Time: 07/11/16  6:00 AM  Result Value Ref Range Status   MRSA by PCR NEGATIVE NEGATIVE Final    Comment:        The GeneXpert MRSA Assay (FDA approved for NASAL specimens only), is one component of a comprehensive MRSA colonization surveillance program. It is not intended to diagnose MRSA infection nor to guide or monitor treatment for MRSA infections.   Culture, Urine     Status: Abnormal   Collection Time: 07/12/16 10:23 AM  Result Value Ref Range Status   Specimen Description URINE, RANDOM  Final   Special Requests NONE  Final   Culture <10,000 COLONIES/mL INSIGNIFICANT GROWTH (A)  Final   Report Status 07/13/2016 FINAL  Final     Radiology Studies: Ct Angio Ao+bifem W & Or Wo Contrast  Result Date: 07/16/2016 CLINICAL DATA:  83 year old male with a left popliteal artery aneurysm EXAM: CT ANGIOGRAPHY OF ABDOMINAL AORTA WITH ILIOFEMORAL RUNOFF TECHNIQUE: Multidetector CT imaging of the abdomen, pelvis and lower extremities was performed using the standard protocol during bolus administration of intravenous contrast. Multiplanar CT image reconstructions and MIPs were obtained to evaluate the vascular anatomy. CONTRAST:  100 mL Isovue 370  COMPARISON:  None. FINDINGS: VASCULAR Aorta: Minimal atherosclerotic plaque without evidence of aneurysm or dissection. Celiac: Patent without evidence of aneurysm, dissection, vasculitis or significant stenosis. SMA: Patent without evidence of aneurysm, dissection, vasculitis or significant stenosis. Replaced right hepatic artery. Renals: Single right renal artery with minimal atherosclerotic plaque at the origin but no significant stenosis. Two left-sided renal arteries. Heterogeneous atherosclerotic plaque at the origin of the dominant renal  artery results in mild to moderate focal stenosis. Tiny accessory renal artery arising just inferior to the main renal artery without evidence of stenosis. IMA: Patent without evidence of aneurysm, dissection, vasculitis or significant stenosis. RIGHT Lower Extremity Inflow: Mild atherosclerotic plaque without evidence of stenosis, aneurysm or dissection. Outflow: Calcified plaque along the posterior wall of the common femoral artery without significant stenosis. The profunda femoral artery is widely patent. Scattered atherosclerotic plaque throughout the superficial femoral artery. Aneurysmal dilatation of the right popliteal artery with a maximal diameter of 1.8 x 1.5 cm. Mild smooth peripheral wall adherent thrombus. Contrast material does not extend below the level of the popliteal artery. Runoff: Very limited evaluation of the runoff artery secondary to non opacification with contrast. There is significant peripheral calcification. LEFT Lower Extremity Inflow: Heterogeneous atherosclerotic plaque without evidence of stenosis, dissection or aneurysm. Outflow: Mild calcified plaque along the posterior wall of the common femoral artery without stenosis. Mild atherosclerotic plaque throughout the superficial femoral artery without stenosis. The profunda femoral artery is patent. Unfortunately, very limited evaluation of the popliteal fossa secondary to severe streak artifact related to the patient's total knee arthroplasty prosthesis. There appears to be a large of fusiform aneurysmal dilatation of the popliteal artery measuring up to approximately 3.5 cm on the sagittal reformatted views. There is heterogeneous density throughout the aneurysm suggesting the presence of intramural thrombus. Runoff: Very limited evaluation of the runoff artery secondary to non opacification with contrast. Atherosclerotic calcifications are present within the vessels. Veins: No obvious venous abnormality within the limitations of  this arterial phase study. Infrarenal IVC filter in place. This appears to be a [employer]. Review of the MIP images confirms the above findings. NON-VASCULAR Lower chest: Moderately large layering right pleural effusion and small layering left pleural effusion. There is associated bilateral lower lobe atelectasis. Mild centrilobular emphysema. No suspicious mass or nodule. Mild cardiomegaly with evidence of prior transarterial atrial valve replacement. No pericardial effusion. Hepatobiliary: Normal hepatic contour and morphology. Several small circumscribed low-attenuation lesions are too small for accurate characterization but statistically highly likely to represent benign cysts. The gallbladder is decompressed. No cholelithiasis or biliary ductal dilatation. Pancreas: Unremarkable. No pancreatic ductal dilatation or surrounding inflammatory changes. Spleen: Normal in size without focal abnormality. Adrenals/Urinary Tract: Low-attenuation thickening of the left adrenal gland most consistent with adrenal hyperplasia. The right adrenal gland is also minimally thickened. No definite nodules. 3.9 cm simple cyst exophytic from the upper pole of the right kidney. 5 cm simple cyst exophytic from the interpolar left kidney. 3 cm bilobed cyst arising from the medial aspect of the left upper pole. Additional 1.8 cm cyst in the right lower pole. No enhancing renal lesion, nephrolithiasis or hydronephrosis. Heavily trabeculated bladder wall with numerous small cellules/ diverticulum along the posterior aspect of the bladder. This is likely secondary to outlet obstruction given massive prostatomegaly. Stomach/Bowel: Large rectal stool burden as can be seen in the setting of constipation. The stool burden borders on fecal impaction. Colonic diverticular disease without CT evidence of active inflammation. No focal bowel wall thickening or  evidence obstruction. Lymphatic: No suspicious lymphadenopathy. Reproductive:  Massive prostatomegaly. The prostate gland measures 6.4 x 5.6 cm. Other: Fat containing left indirect inguinal hernia. Small fat containing right direct inguinal hernia. Musculoskeletal: No acute fracture or aggressive appearing lytic or blastic osseous lesion. Left total knee arthroplasty prosthesis. L5-S1 degenerative disc disease. IMPRESSION: VASCULAR 1. Limited CTA runoff due to multiple factors. There is limited evaluation of the left popliteal fossa which is unfortunately the region of clinical interest secondary to severe streak artifact from the patient's left knee total arthroplasty prosthesis. Additionally, the contrast bolus tapers out bilaterally in the region of the popliteal arteries with no contrast opacification in the distal popliteal or runoff arteries. This is likely due to low cardiac output and delayed transit of the contrast bolus. 2. Incompletely imaged large fusiform left popliteal artery aneurysm. By my best estimate, the aneurysm measures at least 3.5 cm in diameter. There is heterogeneous density within the aneurysm suggesting wall adherent mural thrombus. Unfortunately, I cannot comment if the popliteal artery is patent nor completely thrombosed secondary to the limitations described above. 3. Aneurysmal dilatation of the right popliteal artery measuring up to 1.8 cm. There is smooth peripheral wall adherent thrombus in the aneurysmal segment. 4. No evidence of aortic or other arterial aneurysm. Overall, the patient's vessels are globally ectatic. 5. Diffuse atherosclerotic vascular calcification without hemodynamically significant stenosis. 6. Mild -moderate focal stenosis at the origin of the left dominant renal artery. 7. Cardiomegaly with evidence of prior transarterial aortic valve replacement. NON-VASCULAR 1. Massive prostatomegaly likely resulting in bladder outlet obstruction with a heavily trabeculated bladder and numerous small bladder diverticulum versus changes of the cystitis  cystica/glandularis along the posterior wall. Consider urology referral for further evaluation. 2. Moderate right and small left layering pleural effusions with associated lower lobe atelectasis. 3. Colonic diverticular disease without CT evidence of active inflammation. 4. Renal and probable hepatic cysts 5. Bilateral left greater than right adrenal gland hyperplasia 6. Fat containing left indirect inguinal hernia and smaller fat containing right direct inguinal hernia. 7. Left total knee arthroplasty. 8. Large colonic stool burden consistent with constipation. Signed, Sterling BigHeath K. McCullough, MD [facility] Electronically Signed   By: Malachy MoanHeath  McCullough M.D.   On: 07/16/2016 08:28    Scheduled Meds: . aspirin EC  81 mg Oral Daily  . azithromycin  500 mg Oral Daily  . cefpodoxime  200 mg Oral Q12H  . donepezil  5 mg Oral QHS  . finasteride  5 mg Oral Daily  . furosemide  40 mg Oral Daily  . mouth rinse  15 mL Mouth Rinse BID  . metoprolol tartrate  25 mg Oral BID  . pravastatin  40 mg Oral q1800  . sodium chloride flush  10-40 mL Intracatheter Q12H  . tamsulosin  0.4 mg Oral QPC supper   Continuous Infusions:    LOS: 6 days   Raelie Lohr, Scheryl MartenSTEPHEN K, MD [facility] Pager [phone]  If 7PM-7AM, please contact night-coverage [email or web address] Password [email or web address] 07/16/2016, 5:16 PM

## 2016-07-16 NOTE — Clinical Social Work Note (Addendum)
CSW advised that patient taken off tele-sitter at approx. 11 am today. If medically stable, patient will be able to discharge to [facility] after not having a sitter for 24 hours. Patient has [employer] insurance and authorization has been received and is good through Friday, 3/23 per Albin Fellingarla, Admissions Director [facility]. CSW will continue to follow and facilitate discharge to SNF when medicall stable.  Genelle BalVanessa Terrance Usery, MSW, LCSW Licensed Clinical Social Worker Clinical Social Work Department [facility] [phone]

## 2016-07-17 DIAGNOSIS — I724 Aneurysm of artery of lower extremity: Secondary | ICD-10-CM

## 2016-07-17 DIAGNOSIS — R0602 Shortness of breath: Secondary | ICD-10-CM

## 2016-07-17 DIAGNOSIS — A4151 Sepsis due to Escherichia coli [E. coli]: Principal | ICD-10-CM

## 2016-07-17 LAB — BASIC METABOLIC PANEL
Anion gap: 10 (ref 5–15)
BUN: 27 mg/dL — AB (ref 6–20)
CHLORIDE: 102 mmol/L (ref 101–111)
CO2: 30 mmol/L (ref 22–32)
Calcium: 8.2 mg/dL — ABNORMAL LOW (ref 8.9–10.3)
Creatinine, Ser: 1.06 mg/dL (ref 0.61–1.24)
GFR calc Af Amer: >60 mL/min (ref >60)
GFR calc non Af Amer: >60 mL/min (ref >60)
GLUCOSE: 106 mg/dL — AB (ref 65–99)
POTASSIUM: 3.3 mmol/L — AB (ref 3.5–5.1)
SODIUM: 142 mmol/L (ref 135–145)

## 2016-07-17 MED ORDER — FUROSEMIDE 20 MG PO TABS: 20.0000 mg | ORAL_TABLET | Freq: Every day | ORAL | Status: DC

## 2016-07-17 MED ORDER — POTASSIUM CHLORIDE CRYS ER 20 MEQ PO TBCR
40.0000 meq | EXTENDED_RELEASE_TABLET | Freq: Once | ORAL | Status: AC
  Administered 2016-07-17: 40 meq via ORAL
  Filled 2016-07-17: qty 2

## 2016-07-17 MED ORDER — CEFPODOXIME PROXETIL 200 MG PO TABS: 200.0000 mg | ORAL_TABLET | Freq: Two times a day (BID) | ORAL | 0 refills | Status: AC

## 2016-07-17 MED ORDER — POTASSIUM CHLORIDE CRYS ER 20 MEQ PO TBCR: 20.0000 meq | EXTENDED_RELEASE_TABLET | Freq: Every day | ORAL | 0 refills | Status: DC

## 2016-07-17 MED ORDER — METOPROLOL TARTRATE 25 MG PO TABS: 25.0000 mg | ORAL_TABLET | Freq: Two times a day (BID) | ORAL | Status: DC

## 2016-07-17 NOTE — Progress Notes (Signed)
Physical Therapy Treatment Patient Details Name: Max Martinez BillingJack F Kroenke MRN: [id number] DOB: 1925/03/17 Today's Date: 07/17/2016    History of Present Illness Pt is an 83 y/o male admitted secondary to fever, cough and sepsis. PMH including but not limited to dementia, CAD, CHF, a-fib, s/p TAVR in 2016.    PT Comments    Limited by pt resistance to mobilize to warm up ROM exercise and sitting EOB.  Maximal encouragement not enough to get pt up on his feet for standing or gait activity.    Follow Up Recommendations  SNF     Equipment Recommendations  None recommended by PT    Recommendations for Other Services       Precautions / Restrictions Precautions Precautions: Fall Restrictions Weight Bearing Restrictions: No    Mobility  Bed Mobility Overal bed mobility: Needs Assistance Bed Mobility: Supine to Sit     Supine to sit: Mod assist     General bed mobility comments: Maximal encouragement to EOB with mod assist then pt drew himself into bed despite encouragement  verbally and physically to get OOB and do a standing/walking activity.  Transfers                 General transfer comment: Unable to get patient to get OOB  Ambulation/Gait                 Stairs            Wheelchair Mobility    Modified Rankin (Stroke Patients Only)       Balance   Sitting-balance support: Feet supported Sitting balance-[name]: Fair                              Cognition Arousal/Alertness: Awake/alert Behavior During Therapy: Flat affect;WFL for tasks assessed/performed Overall Cognitive Status: History of cognitive impairments - at baseline                      Exercises General Exercises - Lower Extremity Heel Slides: AROM;Both;10 reps;Other (comment) (graded resistance in extension )    General Comments        Pertinent Vitals/Pain Pain Assessment: Faces Faces Pain Scale: No hurt    Home Living                       Prior Function            PT Goals (current goals can now be found in the care plan section) Acute Rehab PT Goals Time For Goal Achievement: 07/26/16 Potential to Achieve Goals: Fair Progress towards PT goals: Not progressing toward goals - comment (pt resistant to mobility throughout session; very limited)    Frequency    Min 2X/week      PT Plan Current plan remains appropriate    Co-evaluation             End of Session   Activity Tolerance: Patient tolerated treatment well Patient left: in bed;with call bell/phone within reach;with bed alarm set Nurse Communication: Mobility status PT Visit Diagnosis: Other abnormalities of gait and mobility (R26.89)     Time: 7829-56211207-1218 PT Time Calculation (min) (ACUTE ONLY): 11 min  Charges:  $Therapeutic Activity: 8-22 mins                    G CodesEliseo Gum:       Shanena Pellegrino V Jeanice Dempsey 07/17/2016, 4:33 PM 07/17/2016  Donnella Sham, PT [phone]  (pager)

## 2016-07-17 NOTE — Discharge Summary (Signed)
Physician Discharge Summary  Max Martinez ZOX:[id number] DOB: 1925-04-02 DOA: 07/10/2016  PCP: Pcp Not In System  Admit date: 07/10/2016 Discharge date: 07/17/2016  Admitted From: Home Disposition: SNF  Recommendations for Outpatient Follow-up:  1. Follow up with PCP in 1 week 2. Vascular surgery follow-up for popliteal aneurysm 3. Continue antibiotics for total of 14 days; End date: 07/25/16 4. Repeat BMP to check potassium since starting lasix 5. Please follow up on the following pending results:   Discharge Condition: Stable CODE STATUS: DNR/DNI Diet recommendation: Heart healthy   Brief/Interim Summary:  Admission HPI written by Alberteen Sam, MD    Chief Complaint: Fever, cough  HPI: Max Martinez is a 82 y.o. male with a past medical history significant for dementia, HTN, CAD, AS s/p TAVR in 2016, Afib not on anticoagulation and CHF EF 45% who presents with fever, cough, vomiting for 1 day.  All history collected from chart, as patient is demented and sluggish from sepsis.  Per notes, he lives with daughter, has advanced dementia but is ambulatory and can participate in self cares at baseline.  She noticed a new cough yesterday, as well as fatigue, decreased activity, and decreased oral intake. Then today he had vomiting in the clinic incontinence, and fever, so she brought him to the ER.  ED course: -Temp 102.60F, heart rate 94, respirations 28, pulse ox 86% on room air, blood pressure 121/72 -Na 141, K 3.8, Cr 1.19 (baseline 1.1), WBC 4.4K, Hgb 13.9 -Troponin negative -Lactate 3.72 -Chest x-ray showed a patchy opacity in the right base -Cultures were obtained, gentle fluids and ceftriaxone and azithromycin were administered, and TRH were asked to evaluate for sepsis from pneumonia -While in the ER, his BP dropped below 90 mmHg systolic and so a full 30 cc/kg bolus was administered    Hospital course:  Sepsis secondary to E coli bacteremia Initially  tachycardia, tachypnea and fever. Treated empirically with Ceftriaxone and azithromycin. Transitioned to [facility] by mouth before discharge  E coli bacteremia Secondary to UTI although urine culture with insignificant growth (obtained after antibiotic therapy was started). Management as above.  Acute respiratory failure with hypoxia Unknown cause. Possibly secondary to possible pneumonia vs atelectasis. Improved and is now on room air.  Hypertension CAD Initially hypotensive which improved with IV fluids. Continued aspirin and statin. Restarted home metoprolol and patient remained normotensive.  Atrial fibrillation CHA2DS2-VASc Score is 5. Not on anticoagulation. Rate controlled. Metoprolol restarted and will continue at discharge  Dementia Continued donepezil  Thrombocytopenia Secondary to acute infection. Improved before discharge.  L. Popliteal artery aneurysm Discussed with vascular surgery. Outpatient follow-up. Mural thrombus present and patient okay with just aspirin per recommendations.  Chronic systolic heart failure Last EF of 45-50% with diffuse hypokinesis seen on echocardiogram from 01/23/15. Stable. Continue lasix and potassium at discharge.  Discharge Diagnoses:  Principal Problem:   Sepsis due to Escherichia coli (E. 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   cefpodoxime 200 MG tablet Commonly known as:  VANTIN Take 1 tablet (200 mg total) by mouth every 12 (twelve) hours.   donepezil 5 MG tablet Commonly known as:  ARICEPT Take 5 mg by mouth at bedtime.   finasteride 5 MG tablet Commonly known as:  PROSCAR TAKE 1 TABLET BY MOUTH DAILY   furosemide 20 MG tablet Commonly known as:  LASIX Take 1 tablet (20 mg total) by mouth daily.   metoprolol tartrate 25 MG tablet Commonly known as:  LOPRESSOR Take 1 tablet (25 mg total) by mouth 2 (two) times daily.   potassium chloride SA 20 MEQ tablet Commonly known as:  K-DUR,KLOR-CON Take 1 tablet (20 mEq total) by mouth daily. Start taking on:  07/18/2016   pravastatin 40 MG tablet Commonly known as:  PRAVACHOL Take 40 mg by mouth daily.   tamsulosin 0.4 MG Caps capsule Commonly known as:  FLOMAX Take 0.4 mg by mouth daily after supper.      Follow-up Information    [facility]. Schedule an appointment as soon as possible for a visit in 2 week(s).   Contact information: [address] [place] [address] [phone]       Waverly Ferrari, MD. Schedule an appointment as soon as possible for a visit in 2 week(s).   Specialties:  Vascular Surgery, Cardiology Why:  Popliteal aneurysm Contact information: [address] [place] [address] [phone]          No Known Allergies  Consultations:  Vascular surgery (phone consult)   Procedures/Studies: Ct Angio Ao+bifem W & Or Wo Contrast  Result Date: 07/16/2016 CLINICAL DATA:  83 year old male with a left popliteal artery aneurysm EXAM: CT ANGIOGRAPHY OF ABDOMINAL AORTA WITH ILIOFEMORAL RUNOFF TECHNIQUE: Multidetector CT imaging of the abdomen, pelvis and lower extremities was performed using the standard protocol during bolus administration of intravenous contrast. Multiplanar CT image reconstructions and MIPs were obtained to evaluate the  vascular anatomy. CONTRAST:  100 mL Isovue 370 COMPARISON:  None. FINDINGS: VASCULAR Aorta: Minimal atherosclerotic plaque without evidence of aneurysm or dissection. Celiac: Patent without evidence of aneurysm, dissection, vasculitis or significant stenosis. SMA: Patent without evidence of aneurysm, dissection, vasculitis or significant stenosis. Replaced right hepatic artery. Renals: Single right renal artery with minimal atherosclerotic plaque at the origin but no significant stenosis. Two left-sided renal arteries. Heterogeneous atherosclerotic plaque at the origin of the dominant renal artery results in mild to moderate focal stenosis. Tiny accessory renal artery arising just inferior to the main renal artery without evidence of stenosis. IMA: Patent without evidence of aneurysm, dissection, vasculitis or significant stenosis. RIGHT Lower Extremity Inflow: Mild atherosclerotic plaque without evidence of stenosis, aneurysm or dissection. Outflow: Calcified plaque along the posterior wall of the common femoral artery without significant stenosis. The profunda femoral artery is widely patent. Scattered atherosclerotic plaque throughout the superficial femoral artery. Aneurysmal dilatation of the right popliteal artery with a maximal diameter of 1.8 x 1.5 cm. Mild smooth peripheral wall adherent thrombus. Contrast material does not extend below the level of the popliteal artery. Runoff: Very limited evaluation of the runoff artery secondary to non opacification with contrast. There is significant peripheral calcification. LEFT Lower Extremity Inflow: Heterogeneous atherosclerotic plaque without evidence of stenosis, dissection or aneurysm. Outflow: Mild calcified plaque along the posterior wall  of the common femoral artery without stenosis. Mild atherosclerotic plaque throughout the superficial femoral artery without stenosis. The profunda femoral artery is patent. Unfortunately, very limited evaluation of the  popliteal fossa secondary to severe streak artifact related to the patient's total knee arthroplasty prosthesis. There appears to be a large of fusiform aneurysmal dilatation of the popliteal artery measuring up to approximately 3.5 cm on the sagittal reformatted views. There is heterogeneous density throughout the aneurysm suggesting the presence of intramural thrombus. Runoff: Very limited evaluation of the runoff artery secondary to non opacification with contrast. Atherosclerotic calcifications are present within the vessels. Veins: No obvious venous abnormality within the limitations of this arterial phase study. Infrarenal IVC filter in place. This appears to be a [employer]. Review of the MIP images confirms the above findings. NON-VASCULAR Lower chest: Moderately large layering right pleural effusion and small layering left pleural effusion. There is associated bilateral lower lobe atelectasis. Mild centrilobular emphysema. No suspicious mass or nodule. Mild cardiomegaly with evidence of prior transarterial atrial valve replacement. No pericardial effusion. Hepatobiliary: Normal hepatic contour and morphology. Several small circumscribed low-attenuation lesions are too small for accurate characterization but statistically highly likely to represent benign cysts. The gallbladder is decompressed. No cholelithiasis or biliary ductal dilatation. Pancreas: Unremarkable. No pancreatic ductal dilatation or surrounding inflammatory changes. Spleen: Normal in size without focal abnormality. Adrenals/Urinary Tract: Low-attenuation thickening of the left adrenal gland most consistent with adrenal hyperplasia. The right adrenal gland is also minimally thickened. No definite nodules. 3.9 cm simple cyst exophytic from the upper pole of the right kidney. 5 cm simple cyst exophytic from the interpolar left kidney. 3 cm bilobed cyst arising from the medial aspect of the left upper pole. Additional 1.8 cm cyst in the  right lower pole. No enhancing renal lesion, nephrolithiasis or hydronephrosis. Heavily trabeculated bladder wall with numerous small cellules/ diverticulum along the posterior aspect of the bladder. This is likely secondary to outlet obstruction given massive prostatomegaly. Stomach/Bowel: Large rectal stool burden as can be seen in the setting of constipation. The stool burden borders on fecal impaction. Colonic diverticular disease without CT evidence of active inflammation. No focal bowel wall thickening or evidence obstruction. Lymphatic: No suspicious lymphadenopathy. Reproductive: Massive prostatomegaly. The prostate gland measures 6.4 x 5.6 cm. Other: Fat containing left indirect inguinal hernia. Small fat containing right direct inguinal hernia. Musculoskeletal: No acute fracture or aggressive appearing lytic or blastic osseous lesion. Left total knee arthroplasty prosthesis. L5-S1 degenerative disc disease. IMPRESSION: VASCULAR 1. Limited CTA runoff due to multiple factors. There is limited evaluation of the left popliteal fossa which is unfortunately the region of clinical interest secondary to severe streak artifact from the patient's left knee total arthroplasty prosthesis. Additionally, the contrast bolus tapers out bilaterally in the region of the popliteal arteries with no contrast opacification in the distal popliteal or runoff arteries. This is likely due to low cardiac output and delayed transit of the contrast bolus. 2. Incompletely imaged large fusiform left popliteal artery aneurysm. By my best estimate, the aneurysm measures at least 3.5 cm in diameter. There is heterogeneous density within the aneurysm suggesting wall adherent mural thrombus. Unfortunately, I cannot comment if the popliteal artery is patent nor completely thrombosed secondary to the limitations described above. 3. Aneurysmal dilatation of the right popliteal artery measuring up to 1.8 cm. There is smooth peripheral wall  adherent thrombus in the aneurysmal segment. 4. No evidence of aortic or other arterial aneurysm. Overall, the patient's vessels are globally  ectatic. 5. Diffuse atherosclerotic vascular calcification without hemodynamically significant stenosis. 6. Mild -moderate focal stenosis at the origin of the left dominant renal artery. 7. Cardiomegaly with evidence of prior transarterial aortic valve replacement. NON-VASCULAR 1. Massive prostatomegaly likely resulting in bladder outlet obstruction with a heavily trabeculated bladder and numerous small bladder diverticulum versus changes of the cystitis cystica/glandularis along the posterior wall. Consider urology referral for further evaluation. 2. Moderate right and small left layering pleural effusions with associated lower lobe atelectasis. 3. Colonic diverticular disease without CT evidence of active inflammation. 4. Renal and probable hepatic cysts 5. Bilateral left greater than right adrenal gland hyperplasia 6. Fat containing left indirect inguinal hernia and smaller fat containing right direct inguinal hernia. 7. Left total knee arthroplasty. 8. Large colonic stool burden consistent with constipation. Signed, Sterling Big, MD [facility] Radiology Electronically Signed   By: Malachy Moan M.D.   On: 07/16/2016 08:28   Dg Chest Port 1 View  Result Date: 07/13/2016 CLINICAL DATA:  Shortness of breath EXAM: PORTABLE CHEST 1 VIEW COMPARISON:  07/10/2016 FINDINGS: Grossly unchanged cardiac silhouette and mediastinal contours with atherosclerotic plaque within the thoracic aorta. Stable sequela of trans aortic valve replacement. Interval placement of right upper extremity approach PICC line with tip projected over the superior cavoatrial junction. Pulmonary vasculature is less distinct than present examination. Worsening ill-defined heterogeneous interstitial and airspace opacities involving the majority of  the right lung as well as the left lower/retrocardiac lung. Interval development of small bilateral effusions. Worsening left basilar/retrocardiac opacities. No pneumothorax. No acute osseus abnormalities. IMPRESSION: 1. Findings worrisome for extensive multifocal infection, right greater than left, though note, asymmetric edema could have have a similar appearance. 2. Interval development small bilateral effusions. 3. Right upper extremity PICC line tip projects over the superior cavoatrial junction. Electronically Signed   By: Simonne Come M.D.   On: 07/13/2016 09:48   Dg Chest Port 1 View  Result Date: 07/10/2016 CLINICAL DATA:  83 year old male with fever. EXAM: PORTABLE CHEST 1 VIEW COMPARISON:  Chest radiograph dated 01/21/2015 FINDINGS: There is shallow inspiration. A patchy area of hazy density at the right lung base may represent scarring related to prior infection or recurrent pneumonia. Clinical correlation is recommended. Left lung base subsegmental atelectatic changes noted. There is no pleural effusion or pneumothorax. The cardiac silhouette is within normal limits. Probable small calcified left hilar lymph node. No acute osseous pathology identified. IMPRESSION: Patchy area of hazy density at the right lung base may represent scarring related to prior infection or recurrent pneumonia. Clinical correlation and follow-up recommended. Electronically Signed   By: Elgie Collard M.D.   On: 07/10/2016 20:07      Subjective: No chest pain or dyspnea today.  Discharge Exam: Vitals:   07/17/16 0440 07/17/16 0909  BP: (!) 123/50 94/62  Pulse: 62 (!) 58  Resp: 18 18  Temp: 98 F (36.7 C) 97.9 F (36.6 C)   Vitals:   07/16/16 2120 07/17/16 0100 07/17/16 0440 07/17/16 0909  BP: 135/80  (!) 123/50 94/62  Pulse: 68 (!) 58 62 (!) 58  Resp: 18  18 18   Temp: 97.8 F (36.6 C)  98 F (36.7 C) 97.9 F (36.6 C)  TempSrc: Oral  Oral Oral  SpO2: 96%  94% 94%  Weight: 67.2 kg (148 lb 1.6 oz)   67.2 kg (148 lb 2.4 oz)   Height:        General: Pt is alert, awake, not in  acute distress Cardiovascular: RRR, S1/S2 +, 2/6 systolic murmur Respiratory: CTA bilaterally, no wheezing, no rhonchi Abdominal: Soft, NT, ND, bowel sounds + Extremities: no edema, no cyanosis    The results of significant diagnostics from this hospitalization (including imaging, microbiology, ancillary and laboratory) are listed below for reference.     Microbiology: Recent Results (from the past 240 hour(s))  Blood Culture (routine x 2)     Status: None   Collection Time: 07/10/16  9:00 PM  Result Value Ref Range Status   Specimen Description BLOOD LEFT ARM  Final   Special Requests IN PEDIATRIC BOTTLE 1CC  Final   Culture NO GROWTH 5 DAYS  Final   Report Status 07/15/2016 FINAL  Final  Blood Culture (routine x 2)     Status: Abnormal   Collection Time: 07/10/16  9:40 PM  Result Value Ref Range Status   Specimen Description BLOOD LEFT FOOT  Final   Special Requests BOTTLES DRAWN AEROBIC ONLY 5CC  Final   Culture  Setup Time   Final    GRAM NEGATIVE RODS AEROBIC BOTTLE ONLY CRITICAL RESULT CALLED TO, READ BACK BY AND VERIFIED WITH: Edd Arbour.D. 14:50 07/11/16 ([email or web address])    Culture ESCHERICHIA COLI (A)  Final   Report Status 07/13/2016 FINAL  Final   Organism ID, Bacteria ESCHERICHIA COLI  Final      Susceptibility   Escherichia coli - MIC*    AMPICILLIN >=32 RESISTANT Resistant     CEFAZOLIN <=4 SENSITIVE Sensitive     CEFEPIME <=1 SENSITIVE Sensitive     CEFTAZIDIME <=1 SENSITIVE Sensitive     CEFTRIAXONE <=1 SENSITIVE Sensitive     CIPROFLOXACIN >=4 RESISTANT Resistant     GENTAMICIN <=1 SENSITIVE Sensitive     IMIPENEM <=0.25 SENSITIVE Sensitive     TRIMETH/SULFA >=320 RESISTANT Resistant     AMPICILLIN/SULBACTAM >=32 RESISTANT Resistant     PIP/TAZO <=4 SENSITIVE Sensitive     Extended ESBL NEGATIVE Sensitive     * ESCHERICHIA COLI  Blood Culture ID Panel (Reflexed)     Status:  Abnormal   Collection Time: 07/10/16  9:40 PM  Result Value Ref Range Status   Enterococcus species NOT DETECTED NOT DETECTED Final   Listeria monocytogenes NOT DETECTED NOT DETECTED Final   Staphylococcus species NOT DETECTED NOT DETECTED Final   Staphylococcus aureus NOT DETECTED NOT DETECTED Final   Streptococcus species NOT DETECTED NOT DETECTED Final   Streptococcus agalactiae NOT DETECTED NOT DETECTED Final   Streptococcus pneumoniae NOT DETECTED NOT DETECTED Final   Streptococcus pyogenes NOT DETECTED NOT DETECTED Final   Acinetobacter baumannii NOT DETECTED NOT DETECTED Final   Enterobacteriaceae species DETECTED (A) NOT DETECTED Final    Comment: Enterobacteriaceae represent a large family of gram-negative bacteria, not a single organism. CRITICAL RESULT CALLED TO, READ BACK BY AND VERIFIED WITH: M. Mayford Knife Pharm.D. 14:50 07/11/16 ([email or web address])    Enterobacter cloacae complex NOT DETECTED NOT DETECTED Final   Escherichia coli DETECTED (A) NOT DETECTED Final    Comment: CRITICAL RESULT CALLED TO, READ BACK BY AND VERIFIED WITH: M. Mayford Knife Pharm.D. 14:50 07/11/16 ([email or web address])    Klebsiella oxytoca NOT DETECTED NOT DETECTED Final   Klebsiella pneumoniae NOT DETECTED NOT DETECTED Final   Proteus species NOT DETECTED NOT DETECTED Final   Serratia marcescens NOT DETECTED NOT DETECTED Final   Carbapenem resistance NOT DETECTED NOT DETECTED Final   Haemophilus influenzae NOT DETECTED NOT DETECTED Final   Neisseria meningitidis NOT DETECTED NOT DETECTED Final   Pseudomonas  aeruginosa NOT DETECTED NOT DETECTED Final   Candida albicans NOT DETECTED NOT DETECTED Final   Candida glabrata NOT DETECTED NOT DETECTED Final   Candida krusei NOT DETECTED NOT DETECTED Final   Candida parapsilosis NOT DETECTED NOT DETECTED Final   Candida tropicalis NOT DETECTED NOT DETECTED Final  MRSA PCR Screening     Status: None   Collection Time: 07/11/16  6:00 AM  Result Value Ref Range Status   MRSA by PCR  NEGATIVE NEGATIVE Final    Comment:        The GeneXpert MRSA Assay (FDA approved for NASAL specimens only), is one component of a comprehensive MRSA colonization surveillance program. It is not intended to diagnose MRSA infection nor to guide or monitor treatment for MRSA infections.   Culture, Urine     Status: Abnormal   Collection Time: 07/12/16 10:23 AM  Result Value Ref Range Status   Specimen Description URINE, RANDOM  Final   Special Requests NONE  Final   Culture <10,000 COLONIES/mL INSIGNIFICANT GROWTH (A)  Final   Report Status 07/13/2016 FINAL  Final     Labs: BNP (last 3 results) No results for input(s): BNP in the last 8760 hours. Basic Metabolic Panel:  Recent Labs Lab 07/13/16 0440 07/14/16 0352 07/15/16 0418 07/15/16 0830 07/16/16 0403 07/17/16 0355  NA 140 142 144  --  141 142  K 3.9 2.9* 3.3*  --  3.7 3.3*  CL 109 106 105  --  102 102  CO2 24 29 28   --  29 30  GLUCOSE 101* 93 104*  --  113* 106*  BUN 18 20 23*  --  26* 27*  CREATININE 0.77 0.91 0.99  --  1.06 1.06  CALCIUM 7.6* 7.9* 8.1*  --  8.3* 8.2*  MG  --   --   --  1.7  --   --    Liver Function Tests:  Recent Labs Lab 07/10/16 2057 07/11/16 0434  AST 41 30  ALT 22 18  ALKPHOS 53 39  BILITOT 0.9 0.7  PROT 6.6 5.6*  ALBUMIN 3.6 2.7*   No results for input(s): LIPASE, AMYLASE in the last 168 hours. No results for input(s): AMMONIA in the last 168 hours. CBC:  Recent Labs Lab 07/10/16 2131  07/12/16 0520 07/13/16 0440 07/14/16 0352 07/15/16 0418 07/16/16 0403  WBC 4.4  < > 8.0 8.0 7.7 7.5 8.1  NEUTROABS 4.1  --   --   --   --   --   --   HGB 13.9  < > 12.1* 11.5* 11.9* 12.4* 12.8*  HCT 41.9  < > 37.1* 35.3* 35.9* 37.4* 38.7*  MCV 95.7  < > 95.4 95.7 95.0 93.5 94.2  PLT 93*  < > 95* 103* 105* 109* 149*  < > = values in this interval not displayed. Cardiac Enzymes: No results for input(s): CKTOTAL, CKMB, CKMBINDEX, TROPONINI in the last 168 hours. BNP: Invalid  input(s): POCBNP CBG:  Recent Labs Lab 07/11/16 0733 07/13/16 2147  GLUCAP 108* 129*   D-Dimer No results for input(s): DDIMER in the last 72 hours. Hgb A1c No results for input(s): HGBA1C in the last 72 hours. Lipid Profile No results for input(s): CHOL, HDL, LDLCALC, TRIG, CHOLHDL, LDLDIRECT in the last 72 hours. Thyroid function studies No results for input(s): TSH, T4TOTAL, T3FREE, THYROIDAB in the last 72 hours.  Invalid input(s): FREET3 Anemia work up No results for input(s): VITAMINB12, FOLATE, FERRITIN, TIBC, IRON, RETICCTPCT in the last 72  hours. Urinalysis    Component Value Date/Time   COLORURINE YELLOW 07/12/2016 1022   APPEARANCEUR CLEAR 07/12/2016 1022   LABSPEC 1.014 07/12/2016 1022   PHURINE 5.0 07/12/2016 1022   GLUCOSEU NEGATIVE 07/12/2016 1022   HGBUR MODERATE (A) 07/12/2016 1022   BILIRUBINUR NEGATIVE 07/12/2016 1022   KETONESUR 5 (A) 07/12/2016 1022   PROTEINUR NEGATIVE 07/12/2016 1022   NITRITE NEGATIVE 07/12/2016 1022   LEUKOCYTESUR SMALL (A) 07/12/2016 1022   Sepsis Labs Invalid input(s): PROCALCITONIN,  WBC,  LACTICIDVEN Microbiology Recent Results (from the past 240 hour(s))  Blood Culture (routine x 2)     Status: None   Collection Time: 07/10/16  9:00 PM  Result Value Ref Range Status   Specimen Description BLOOD LEFT ARM  Final   Special Requests IN PEDIATRIC BOTTLE 1CC  Final   Culture NO GROWTH 5 DAYS  Final   Report Status 07/15/2016 FINAL  Final  Blood Culture (routine x 2)     Status: Abnormal   Collection Time: 07/10/16  9:40 PM  Result Value Ref Range Status   Specimen Description BLOOD LEFT FOOT  Final   Special Requests BOTTLES DRAWN AEROBIC ONLY 5CC  Final   Culture  Setup Time   Final    GRAM NEGATIVE RODS AEROBIC BOTTLE ONLY CRITICAL RESULT CALLED TO, READ BACK BY AND VERIFIED WITH: Artelia Laroche Pharm.D. 14:50 07/11/16 ([email or web address])    Culture ESCHERICHIA COLI (A)  Final   Report Status 07/13/2016 FINAL  Final   Organism ID,  Bacteria ESCHERICHIA COLI  Final      Susceptibility   Escherichia coli - MIC*    AMPICILLIN >=32 RESISTANT Resistant     CEFAZOLIN <=4 SENSITIVE Sensitive     CEFEPIME <=1 SENSITIVE Sensitive     CEFTAZIDIME <=1 SENSITIVE Sensitive     CEFTRIAXONE <=1 SENSITIVE Sensitive     CIPROFLOXACIN >=4 RESISTANT Resistant     GENTAMICIN <=1 SENSITIVE Sensitive     IMIPENEM <=0.25 SENSITIVE Sensitive     TRIMETH/SULFA >=320 RESISTANT Resistant     AMPICILLIN/SULBACTAM >=32 RESISTANT Resistant     PIP/TAZO <=4 SENSITIVE Sensitive     Extended ESBL NEGATIVE Sensitive     * ESCHERICHIA COLI  Blood Culture ID Panel (Reflexed)     Status: Abnormal   Collection Time: 07/10/16  9:40 PM  Result Value Ref Range Status   Enterococcus species NOT DETECTED NOT DETECTED Final   Listeria monocytogenes NOT DETECTED NOT DETECTED Final   Staphylococcus species NOT DETECTED NOT DETECTED Final   Staphylococcus aureus NOT DETECTED NOT DETECTED Final   Streptococcus species NOT DETECTED NOT DETECTED Final   Streptococcus agalactiae NOT DETECTED NOT DETECTED Final   Streptococcus pneumoniae NOT DETECTED NOT DETECTED Final   Streptococcus pyogenes NOT DETECTED NOT DETECTED Final   Acinetobacter baumannii NOT DETECTED NOT DETECTED Final   Enterobacteriaceae species DETECTED (A) NOT DETECTED Final    Comment: Enterobacteriaceae represent a large family of gram-negative bacteria, not a single organism. CRITICAL RESULT CALLED TO, READ BACK BY AND VERIFIED WITH: M. Mayford Knife Pharm.D. 14:50 07/11/16 ([email or web address])    Enterobacter cloacae complex NOT DETECTED NOT DETECTED Final   Escherichia coli DETECTED (A) NOT DETECTED Final    Comment: CRITICAL RESULT CALLED TO, READ BACK BY AND VERIFIED WITH: M. Mayford Knife Pharm.D. 14:50 07/11/16 ([email or web address])    Klebsiella oxytoca NOT DETECTED NOT DETECTED Final   Klebsiella pneumoniae NOT DETECTED NOT DETECTED Final   Proteus species NOT DETECTED NOT DETECTED Final   Serratia marcescens NOT  DETECTED NOT DETECTED Final   Carbapenem resistance NOT DETECTED NOT DETECTED Final   Haemophilus influenzae NOT DETECTED NOT DETECTED Final   Neisseria meningitidis NOT DETECTED NOT DETECTED Final   Pseudomonas aeruginosa NOT DETECTED NOT DETECTED Final   Candida albicans NOT DETECTED NOT DETECTED Final   Candida glabrata NOT DETECTED NOT DETECTED Final   Candida krusei NOT DETECTED NOT DETECTED Final   Candida parapsilosis NOT DETECTED NOT DETECTED Final   Candida tropicalis NOT DETECTED NOT DETECTED Final  MRSA PCR Screening     Status: None   Collection Time: 07/11/16  6:00 AM  Result Value Ref Range Status   MRSA by PCR NEGATIVE NEGATIVE Final    Comment:        The GeneXpert MRSA Assay (FDA approved for NASAL specimens only), is one component of a comprehensive MRSA colonization surveillance program. It is not intended to diagnose MRSA infection nor to guide or monitor treatment for MRSA infections.   Culture, Urine     Status: Abnormal   Collection Time: 07/12/16 10:23 AM  Result Value Ref Range Status   Specimen Description URINE, RANDOM  Final   Special Requests NONE  Final   Culture <10,000 COLONIES/mL INSIGNIFICANT GROWTH (A)  Final   Report Status 07/13/2016 FINAL  Final     Time coordinating discharge: Over 30 minutes  SIGNED:   Jacquelin Hawkingalph Nettey, MD [facility] 07/17/2016, 10:14 AM Pager [phone]  If 7PM-7AM, please contact night-coverage [email or web address] Password [email or web address]

## 2016-07-17 NOTE — Clinical Social Work Placement (Signed)
   CLINICAL SOCIAL WORK PLACEMENT  NOTE 07/17/16 - DISCHARGED TO [facility] VIA AMBULANCE  Date:  07/17/2016  Patient Details  Name: Max Martinez MRN: [id number] Date of Birth: Sep 20, 1924  Clinical Social Work is seeking post-discharge placement for this patient at the Skilled  Nursing Facility level of care (*CSW will initial, date and re-position this form in  chart as items are completed):  Yes   Patient/family provided with [facility] Clinical Social Work Department's list of facilities offering this level of care within the geographic area requested by the patient (or if unable, by the patient's family).  Yes   Patient/family informed of their freedom to choose among providers that offer the needed level of care, that participate in Medicare, Medicaid or managed care program needed by the patient, have an available bed and are willing to accept the patient.  Yes   Patient/family informed of [facility] ownership interest in [facility] and [facility], as well as of the fact that they are under no obligation to receive care at these facilities.  PASRR submitted to EDS on       PASRR number received on       Existing PASRR number confirmed on 07/12/16     FL2 transmitted to all facilities in geographic area requested by pt/family on 07/12/16     FL2 transmitted to all facilities within larger geographic area on       Patient informed that his/her managed care company has contracts with or will negotiate with certain facilities, including the following:         YES - Patient/family informed of bed offers received.  Patient chooses bed at  [facility]     Physician recommends and patient chooses bed at      Patient to be transferred to  [facility] on  07/17/16.  Patient to be transferred to facility by  ambulance     Patient family notified on  07/17/16 of transfer.  Name of family member notified:   Daughter Misty StanleyLisa at the bedside.     PHYSICIAN Please sign  FL2, Please sign DNR     Additional Comment:    _______________________________________________ Cristobal Goldmannrawford, Eliu Batch Bradley, LCSW 07/17/2016, 5:18 PM

## 2016-07-17 NOTE — Progress Notes (Signed)
Discharge Note Patient [facility] to SNF[facility]. AVS reviewed with daughter, heart failure packet provided, Telemetry discontinued and transport called.

## 2016-07-17 NOTE — Progress Notes (Signed)
Attempted to call reoprt x2, left call back number

## 2016-07-18 ENCOUNTER — Encounter: Payer: Self-pay | Admitting: Internal Medicine

## 2016-07-18 ENCOUNTER — Non-Acute Institutional Stay (SKILLED_NURSING_FACILITY): Payer: Medicare HMO | Admitting: Internal Medicine

## 2016-07-18 DIAGNOSIS — E876 Hypokalemia: Secondary | ICD-10-CM | POA: Diagnosis not present

## 2016-07-18 DIAGNOSIS — R7881 Bacteremia: Secondary | ICD-10-CM | POA: Diagnosis not present

## 2016-07-18 DIAGNOSIS — G301 Alzheimer's disease with late onset: Secondary | ICD-10-CM

## 2016-07-18 DIAGNOSIS — E440 Moderate protein-calorie malnutrition: Secondary | ICD-10-CM

## 2016-07-18 DIAGNOSIS — F0280 Dementia in other diseases classified elsewhere without behavioral disturbance: Secondary | ICD-10-CM

## 2016-07-18 DIAGNOSIS — R531 Weakness: Secondary | ICD-10-CM | POA: Diagnosis not present

## 2016-07-18 DIAGNOSIS — I480 Paroxysmal atrial fibrillation: Secondary | ICD-10-CM

## 2016-07-18 DIAGNOSIS — D638 Anemia in other chronic diseases classified elsewhere: Secondary | ICD-10-CM

## 2016-07-18 DIAGNOSIS — D696 Thrombocytopenia, unspecified: Secondary | ICD-10-CM | POA: Diagnosis not present

## 2016-07-18 DIAGNOSIS — J181 Lobar pneumonia, unspecified organism: Secondary | ICD-10-CM

## 2016-07-18 DIAGNOSIS — J189 Pneumonia, unspecified organism: Secondary | ICD-10-CM

## 2016-07-18 DIAGNOSIS — B9620 Unspecified Escherichia coli [E. coli] as the cause of diseases classified elsewhere: Secondary | ICD-10-CM

## 2016-07-18 DIAGNOSIS — I5022 Chronic systolic (congestive) heart failure: Secondary | ICD-10-CM | POA: Diagnosis not present

## 2016-07-18 DIAGNOSIS — N3000 Acute cystitis without hematuria: Secondary | ICD-10-CM

## 2016-07-18 DIAGNOSIS — N400 Enlarged prostate without lower urinary tract symptoms: Secondary | ICD-10-CM | POA: Diagnosis not present

## 2016-07-18 DIAGNOSIS — I724 Aneurysm of artery of lower extremity: Secondary | ICD-10-CM

## 2016-07-18 NOTE — Progress Notes (Signed)
[facility]  PCP: Pcp Not In System   Code Status: DNR  Goals of care: Advanced Directive information Advanced Directives 07/18/2016  Does Patient Have a Medical Advance Directive? Yes  Type of Advance Directive Out of facility DNR (pink MOST or yellow form)  Does patient want to make changes to medical advance directive? No - Patient declined  Copy of Healthcare Power of Attorney in Chart? -  Pre-existing out of facility DNR order (yellow form or pink MOST form) -       Extended Emergency Contact Information Primary Emergency Contact: Rogelia BogaWelker,[name]  [place] Home Phone: [phone] Relation: Daughter Secondary Emergency Contact: Tretha SciaraLawson,Rosemary  [place] Home Phone: [phone] Relation: Daughter   No Known Allergies  Chief Complaint  Patient presents with  . New Admit To SNF    New Admission Visit      HPI:  Patient is a 83 y.o. male seen today for short term rehabilitation post hospital admission from 14th of March 2018-21st of March 2018 with sepsis secondary to Escherichia coli bacteremia. he was placed on IV antibiotics and later switched to oral antibiotic. He was also treated for acute respiratory failure from pneumonia. He is seen in his room today. He has medical history of dementia, hypertension, coronary artery disease, aortic stenosis status post TAVR, atrial fibrillation, congestive heart failure with ejection fraction 45% among others.   Review of Systems: Limited with his dementia. Constitutional: Negative for fever, chills. Feels weak and tired. HENT: Negative for headache, congestion. Positive for post nasal drip.   Eyes: Negative for eye pain, blurred vision, double vision and discharge.  Respiratory: Negative for shortness of breath. Positive for cough.   Cardiovascular: Negative for chest pain, palpitations.  Gastrointestinal: Negative for heartburn, nausea, vomiting. Positive for abdominal pain.  Doesn't remember when his last bowel movement was. Genitourinary: Negative for dysuria Musculoskeletal: Negative for back pain, fall in the facility.  Skin: Negative for itching, rash.  Neurological: Negative for dizziness. Psychiatric/Behavioral: Negative for depression   Past Medical History:  Diagnosis Date  . A-fib (HCC) 01/21/2015  . Burn   . CHF (congestive heart failure) (HCC)   . Coronary artery disease   . Hyperlipidemia   . Hypertension   . Peripheral vascular disease [facility](HCC)    Past Surgical History:  Procedure Laterality Date  . SKIN GRAFT  x11. Aug-Dec 2010  . SUPRAVALVULAR AORTIC STENOSIS REPAIR  2016   Social History:   reports that he has never smoked. He has never used smokeless tobacco. He reports that he does not drink alcohol or use drugs.  Family History  Problem Relation Age of Onset  . CVA Father     Medications: Allergies as of 07/18/2016   No Known Allergies     Medication List       Accurate as of 07/18/16 11:43 AM. Always use your most recent med list.          aspirin EC 81 MG tablet Take 81 mg by mouth daily.   cefpodoxime 200 MG tablet Commonly known as:  VANTIN Take 1 tablet (200 mg total) by mouth every 12 (twelve) hours.   donepezil 5 MG tablet Commonly known as:  ARICEPT Take 5 mg by mouth at bedtime.   finasteride 5 MG tablet Commonly known as:  PROSCAR TAKE 1 TABLET BY MOUTH DAILY   furosemide 20 MG tablet Commonly known as:  LASIX Take 1 tablet (20 mg total) by mouth daily.  metoprolol tartrate 25 MG tablet Commonly known as:  LOPRESSOR Take 1 tablet (25 mg total) by mouth 2 (two) times daily.   potassium chloride SA 20 MEQ tablet Commonly known as:  K-DUR,KLOR-CON Take 1 tablet (20 mEq total) by mouth daily.   pravastatin 40 MG tablet Commonly known as:  PRAVACHOL Take 40 mg by mouth daily.   tamsulosin 0.4 MG Caps capsule Commonly known as:  FLOMAX Take 0.4 mg by mouth daily after supper.        Immunizations: Immunization History  Administered Date(s) Administered  . Influenza,inj,Quad PF,36+ Mos 01/26/2015     Physical Exam: Vitals:   07/18/16 1138  BP: 127/72  Pulse: 71  Resp: 20  Temp: 98.1 F (36.7 C)  TempSrc: Oral  SpO2: 98%  Weight: 148 lb 9.6 oz (67.4 kg)  Height: 5\' 7"  (1.702 m)   Body mass index is 23.27 kg/m.  General- elderly Male, thin built and frail, in no acute distress Head- normocephalic, atraumatic Nose- no nasal discharge Throat- moist mucus membrane, normal oropharynx , with quality to his voice Eyes- PERRLA, EOMI, no pallor, no icterus, no discharge, normal conjunctiva, normal sclera Neck- no cervical lymphadenopathy Cardiovascular- irregular heart rate, systolic murmur present  Respiratory- poor air movement, no wheeze, no rhonchi, no crackles, no use of accessory muscles Abdomen- bowel sounds present, soft, suprapubic tenderness present, no guarding or rigidity Musculoskeletal- able to move all 4 extremities, generalized weakness, arthritis changes to his fingers Neurological- alert and oriented to self only, pleasantly confused Skin- warm and dry Psychiatry- normal mood and affect    Labs reviewed: Basic Metabolic Panel:  Recent Labs  16/10/96 0418 07/15/16 0830 07/16/16 0403 07/17/16 0355  NA 144  --  141 142  K 3.3*  --  3.7 3.3*  CL 105  --  102 102  CO2 28  --  29 30  GLUCOSE 104*  --  113* 106*  BUN 23*  --  26* 27*  CREATININE 0.99  --  1.06 1.06  CALCIUM 8.1*  --  8.3* 8.2*  MG  --  1.7  --   --    Liver Function Tests:  Recent Labs  07/10/16 2057 07/11/16 0434  AST 41 30  ALT 22 18  ALKPHOS 53 39  BILITOT 0.9 0.7  PROT 6.6 5.6*  ALBUMIN 3.6 2.7*   No results for input(s): LIPASE, AMYLASE in the last 8760 hours. No results for input(s): AMMONIA in the last 8760 hours. CBC:  Recent Labs  07/10/16 2131  07/14/16 0352 07/15/16 0418 07/16/16 0403  WBC 4.4  < > 7.7 7.5 8.1  NEUTROABS 4.1  --    --   --   --   HGB 13.9  < > 11.9* 12.4* 12.8*  HCT 41.9  < > 35.9* 37.4* 38.7*  MCV 95.7  < > 95.0 93.5 94.2  PLT 93*  < > 105* 109* 149*  < > = values in this interval not displayed. Cardiac Enzymes: No results for input(s): CKTOTAL, CKMB, CKMBINDEX, TROPONINI in the last 8760 hours. BNP: Invalid input(s): POCBNP CBG:  Recent Labs  07/11/16 0733 07/13/16 2147  GLUCAP 108* 129*    Radiological Exams: Ct Angio Ao+bifem W & Or Wo Contrast  Result Date: 07/16/2016 CLINICAL DATA:  83 year old male with a left popliteal artery aneurysm EXAM: CT ANGIOGRAPHY OF ABDOMINAL AORTA WITH ILIOFEMORAL RUNOFF TECHNIQUE: Multidetector CT imaging of the abdomen, pelvis and lower extremities was performed using the standard protocol during bolus administration  of intravenous contrast. Multiplanar CT image reconstructions and MIPs were obtained to evaluate the vascular anatomy. CONTRAST:  100 mL Isovue 370 COMPARISON:  None. FINDINGS: VASCULAR Aorta: Minimal atherosclerotic plaque without evidence of aneurysm or dissection. Celiac: Patent without evidence of aneurysm, dissection, vasculitis or significant stenosis. SMA: Patent without evidence of aneurysm, dissection, vasculitis or significant stenosis. Replaced right hepatic artery. Renals: Single right renal artery with minimal atherosclerotic plaque at the origin but no significant stenosis. Two left-sided renal arteries. Heterogeneous atherosclerotic plaque at the origin of the dominant renal artery results in mild to moderate focal stenosis. Tiny accessory renal artery arising just inferior to the main renal artery without evidence of stenosis. IMA: Patent without evidence of aneurysm, dissection, vasculitis or significant stenosis. RIGHT Lower Extremity Inflow: Mild atherosclerotic plaque without evidence of stenosis, aneurysm or dissection. Outflow: Calcified plaque along the posterior wall of the common femoral artery without significant stenosis. The  profunda femoral artery is widely patent. Scattered atherosclerotic plaque throughout the superficial femoral artery. Aneurysmal dilatation of the right popliteal artery with a maximal diameter of 1.8 x 1.5 cm. Mild smooth peripheral wall adherent thrombus. Contrast material does not extend below the level of the popliteal artery. Runoff: Very limited evaluation of the runoff artery secondary to non opacification with contrast. There is significant peripheral calcification. LEFT Lower Extremity Inflow: Heterogeneous atherosclerotic plaque without evidence of stenosis, dissection or aneurysm. Outflow: Mild calcified plaque along the posterior wall of the common femoral artery without stenosis. Mild atherosclerotic plaque throughout the superficial femoral artery without stenosis. The profunda femoral artery is patent. Unfortunately, very limited evaluation of the popliteal fossa secondary to severe streak artifact related to the patient's total knee arthroplasty prosthesis. There appears to be a large of fusiform aneurysmal dilatation of the popliteal artery measuring up to approximately 3.5 cm on the sagittal reformatted views. There is heterogeneous density throughout the aneurysm suggesting the presence of intramural thrombus. Runoff: Very limited evaluation of the runoff artery secondary to non opacification with contrast. Atherosclerotic calcifications are present within the vessels. Veins: No obvious venous abnormality within the limitations of this arterial phase study. Infrarenal IVC filter in place. This appears to be a [employer]. Review of the MIP images confirms the above findings. NON-VASCULAR Lower chest: Moderately large layering right pleural effusion and small layering left pleural effusion. There is associated bilateral lower lobe atelectasis. Mild centrilobular emphysema. No suspicious mass or nodule. Mild cardiomegaly with evidence of prior transarterial atrial valve replacement. No  pericardial effusion. Hepatobiliary: Normal hepatic contour and morphology. Several small circumscribed low-attenuation lesions are too small for accurate characterization but statistically highly likely to represent benign cysts. The gallbladder is decompressed. No cholelithiasis or biliary ductal dilatation. Pancreas: Unremarkable. No pancreatic ductal dilatation or surrounding inflammatory changes. Spleen: Normal in size without focal abnormality. Adrenals/Urinary Tract: Low-attenuation thickening of the left adrenal gland most consistent with adrenal hyperplasia. The right adrenal gland is also minimally thickened. No definite nodules. 3.9 cm simple cyst exophytic from the upper pole of the right kidney. 5 cm simple cyst exophytic from the interpolar left kidney. 3 cm bilobed cyst arising from the medial aspect of the left upper pole. Additional 1.8 cm cyst in the right lower pole. No enhancing renal lesion, nephrolithiasis or hydronephrosis. Heavily trabeculated bladder wall with numerous small cellules/ diverticulum along the posterior aspect of the bladder. This is likely secondary to outlet obstruction given massive prostatomegaly. Stomach/Bowel: Large rectal stool burden as can be seen in the setting of constipation.  The stool burden borders on fecal impaction. Colonic diverticular disease without CT evidence of active inflammation. No focal bowel wall thickening or evidence obstruction. Lymphatic: No suspicious lymphadenopathy. Reproductive: Massive prostatomegaly. The prostate gland measures 6.4 x 5.6 cm. Other: Fat containing left indirect inguinal hernia. Small fat containing right direct inguinal hernia. Musculoskeletal: No acute fracture or aggressive appearing lytic or blastic osseous lesion. Left total knee arthroplasty prosthesis. L5-S1 degenerative disc disease. IMPRESSION: VASCULAR 1. Limited CTA runoff due to multiple factors. There is limited evaluation of the left popliteal fossa which is  unfortunately the region of clinical interest secondary to severe streak artifact from the patient's left knee total arthroplasty prosthesis. Additionally, the contrast bolus tapers out bilaterally in the region of the popliteal arteries with no contrast opacification in the distal popliteal or runoff arteries. This is likely due to low cardiac output and delayed transit of the contrast bolus. 2. Incompletely imaged large fusiform left popliteal artery aneurysm. By my best estimate, the aneurysm measures at least 3.5 cm in diameter. There is heterogeneous density within the aneurysm suggesting wall adherent mural thrombus. Unfortunately, I cannot comment if the popliteal artery is patent nor completely thrombosed secondary to the limitations described above. 3. Aneurysmal dilatation of the right popliteal artery measuring up to 1.8 cm. There is smooth peripheral wall adherent thrombus in the aneurysmal segment. 4. No evidence of aortic or other arterial aneurysm. Overall, the patient's vessels are globally ectatic. 5. Diffuse atherosclerotic vascular calcification without hemodynamically significant stenosis. 6. Mild -moderate focal stenosis at the origin of the left dominant renal artery. 7. Cardiomegaly with evidence of prior transarterial aortic valve replacement. NON-VASCULAR 1. Massive prostatomegaly likely resulting in bladder outlet obstruction with a heavily trabeculated bladder and numerous small bladder diverticulum versus changes of the cystitis cystica/glandularis along the posterior wall. Consider urology referral for further evaluation. 2. Moderate right and small left layering pleural effusions with associated lower lobe atelectasis. 3. Colonic diverticular disease without CT evidence of active inflammation. 4. Renal and probable hepatic cysts 5. Bilateral left greater than right adrenal gland hyperplasia 6. Fat containing left indirect inguinal hernia and smaller fat containing right direct inguinal  hernia. 7. Left total knee arthroplasty. 8. Large colonic stool burden consistent with constipation. Signed, Sterling Big, MD [facility] Electronically Signed   By: Malachy Moan M.D.   On: 07/16/2016 08:28   Dg Chest Port 1 View  Result Date: 07/13/2016 CLINICAL DATA:  Shortness of breath EXAM: PORTABLE CHEST 1 VIEW COMPARISON:  07/10/2016 FINDINGS: Grossly unchanged cardiac silhouette and mediastinal contours with atherosclerotic plaque within the thoracic aorta. Stable sequela of trans aortic valve replacement. Interval placement of right upper extremity approach PICC line with tip projected over the superior cavoatrial junction. Pulmonary vasculature is less distinct than present examination. Worsening ill-defined heterogeneous interstitial and airspace opacities involving the majority of the right lung as well as the left lower/retrocardiac lung. Interval development of small bilateral effusions. Worsening left basilar/retrocardiac opacities. No pneumothorax. No acute osseus abnormalities. IMPRESSION: 1. Findings worrisome for extensive multifocal infection, right greater than left, though note, asymmetric edema could have have a similar appearance. 2. Interval development small bilateral effusions. 3. Right upper extremity PICC line tip projects over the superior cavoatrial junction. Electronically Signed   By: Simonne Come M.D.   On: 07/13/2016 09:48   Dg Chest Port 1 View  Result Date: 07/10/2016 CLINICAL DATA:  83 year old male with fever. EXAM: PORTABLE CHEST 1 VIEW COMPARISON:  Chest radiograph dated 01/21/2015 FINDINGS: There is shallow inspiration. A patchy area of hazy density at the right lung base may represent scarring related to prior infection or recurrent pneumonia. Clinical correlation is recommended. Left lung base subsegmental atelectatic changes noted. There is no pleural effusion or pneumothorax. The cardiac  silhouette is within normal limits. Probable small calcified left hilar lymph node. No acute osseous pathology identified. IMPRESSION: Patchy area of hazy density at the right lung base may represent scarring related to prior infection or recurrent pneumonia. Clinical correlation and follow-up recommended. Electronically Signed   By: Elgie Collard M.D.   On: 07/10/2016 20:07    Assessment/Plan  Generalized weakness From physical deconditioning. Will have him work with physical therapy and occupational therapy team to help with gait training and muscle strengthening exercises.fall precautions. Skin care. Encourage to be out of bed.   Escherichia coli bacteremia Currently afebrile. Continue Vantin 200 mg every 12 hours until 07/25/2016. Monitor WBC and temperature curve  Community-acquired pneumonia Right lung base pneumonia. Continue and complete his course of antibiotic. To provide incentive spirometry 10 encourage its use to prevent atelectasis. Pulmonary toileting to be provided.  UTI Monitor clinically, monitor urine output, maintain hydration, continue and complete his antibiotic, provide perineal hygiene  Atrial fibrillation Controlled heart rate. Continue metoprolol tartrate 25 mg daily and monitor her continue baby aspirin on daily basis.  Hypokalemia Monitor BMP with him on Lasix. Continue potassium chloride 20 mEq daily for now.  Anemia of chronic disease Monitor CBC periodically  Thrombocytopenia Monitor platelet count periodically given patient is on aspirin. No bleed reported at present.  Protein calorie malnutrition Likely to progress with his dementia. Registered dietitian consult. Monitor weekly wait for now.  Alzheimer's Dementia without behavioral disturbance Provide supportive care. Continue donepezil 5 mg daily for now. SLP consult. Aspiration precautions given his recent pneumonia. Pressure ulcer prophylaxis to be provided.   Left popliteal aneurysm Will  need follow up with vascular surgery. Continue aspirin  BPH Continue tamsulosin and finasteride for now. Monitor his urine output and for signs of obstruction. Complaints of suprapubic discomfort. Currently on antibiotic rx.   Chronic systolic heart failure Echocardiogram with ejection fraction 45%. Continue metoprolol tartrate 25 mg daily and furosemide 20 mg daily. Continue-supplement. Check BMP. Check weight 3 days a week.    Goals of care: short term rehabilitation   Labs/tests ordered: cbc, cmp 07/22/16  Family/ staff Communication: reviewed care plan with patient and nursing supervisor    Oneal Grout, MD Internal Medicine [facility] [address] [place], [place] [address] Cell Phone (Monday-Friday 8 am - 5 pm): [phone] On Call: [phone] and follow prompts after 5 pm and on weekends Office Phone: [phone] Office Fax: [phone]

## 2016-07-22 LAB — CBC AND DIFFERENTIAL
HCT: 39 % — AB (ref 41–53)
HEMOGLOBIN: 12.7 g/dL — AB (ref 13.5–17.5)
Platelets: 220 10*3/uL (ref 150–399)
WBC: 7.1 10^3/mL

## 2016-07-22 LAB — BASIC METABOLIC PANEL
BUN: 25 mg/dL — AB (ref 4–21)
Creatinine: 1 mg/dL (ref 0.6–1.3)
Glucose: 93 mg/dL
POTASSIUM: 4 mmol/L (ref 3.4–5.3)
SODIUM: 144 mmol/L (ref 137–147)

## 2016-07-22 LAB — HEPATIC FUNCTION PANEL
ALT: 26 U/L (ref 10–40)
AST: 23 U/L (ref 14–40)
Alkaline Phosphatase: 57 U/L (ref 25–125)
BILIRUBIN, TOTAL: 0.6 mg/dL

## 2016-07-30 ENCOUNTER — Encounter: Payer: Self-pay | Admitting: Vascular Surgery

## 2016-07-30 ENCOUNTER — Non-Acute Institutional Stay (SKILLED_NURSING_FACILITY): Payer: Medicare HMO | Admitting: Family

## 2016-07-30 ENCOUNTER — Encounter: Payer: Self-pay | Admitting: Family

## 2016-07-30 DIAGNOSIS — G4700 Insomnia, unspecified: Secondary | ICD-10-CM | POA: Diagnosis not present
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
## 2016-08-06 ENCOUNTER — Encounter: Payer: Self-pay | Admitting: Family

## 2016-08-06 ENCOUNTER — Non-Acute Institutional Stay (SKILLED_NURSING_FACILITY): Payer: Medicare HMO | Admitting: Family

## 2016-08-06 DIAGNOSIS — G4700 Insomnia, unspecified: Secondary | ICD-10-CM | POA: Diagnosis not present

## 2016-08-06 DIAGNOSIS — I5022 Chronic systolic (congestive) heart failure: Secondary | ICD-10-CM | POA: Diagnosis not present

## 2016-08-06 DIAGNOSIS — E782 Mixed hyperlipidemia: Secondary | ICD-10-CM

## 2016-08-06 DIAGNOSIS — N400 Enlarged prostate without lower urinary tract symptoms: Secondary | ICD-10-CM

## 2016-08-06 DIAGNOSIS — R2681 Unsteadiness on feet: Secondary | ICD-10-CM

## 2016-08-06 DIAGNOSIS — I480 Paroxysmal atrial fibrillation: Secondary | ICD-10-CM
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
## 2016-08-07 ENCOUNTER — Encounter: Payer: Self-pay | Admitting: Vascular Surgery

## 2016-08-07 ENCOUNTER — Other Ambulatory Visit: Payer: Self-pay

## 2016-08-07 ENCOUNTER — Ambulatory Visit (INDEPENDENT_AMBULATORY_CARE_PROVIDER_SITE_OTHER): Payer: Medicare HMO | Admitting: Vascular Surgery

## 2016-08-07 VITALS — BP 115/56 | HR 54 | Temp 97.1°F | Resp 20 | Ht 67.0 in | Wt 162.0 lb

## 2016-08-07 DIAGNOSIS — I724 Aneurysm of artery of lower extremity: Secondary | ICD-10-CM
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
## 2016-09-09 ENCOUNTER — Other Ambulatory Visit: Payer: Self-pay | Admitting: *Deleted

## 2016-09-09 ENCOUNTER — Ambulatory Visit (HOSPITAL_COMMUNITY)
Admission: RE | Admit: 2016-09-09 | Discharge: 2016-09-09 | Disposition: A | Discharge status: Home / Self Care | Payer: Medicare HMO | Source: Ambulatory Visit | Attending: Vascular Surgery | Admitting: Vascular Surgery

## 2016-09-09 ENCOUNTER — Telehealth: Payer: Self-pay | Admitting: Vascular Surgery

## 2016-09-09 ENCOUNTER — Encounter (HOSPITAL_COMMUNITY)
Admission: RE | Disposition: A | Discharge status: Home / Self Care | Payer: Self-pay | Source: Ambulatory Visit | Attending: Vascular Surgery

## 2016-09-09 ENCOUNTER — Encounter (HOSPITAL_COMMUNITY): Payer: Self-pay | Admitting: Vascular Surgery

## 2016-09-09 DIAGNOSIS — I739 Peripheral vascular disease, unspecified: Secondary | ICD-10-CM

## 2016-09-09 DIAGNOSIS — I10 Essential (primary) hypertension: Secondary | ICD-10-CM | POA: Diagnosis not present

## 2016-09-09 DIAGNOSIS — I509 Heart failure, unspecified: Secondary | ICD-10-CM | POA: Diagnosis not present

## 2016-09-09 DIAGNOSIS — F0390 Unspecified dementia without behavioral disturbance: Secondary | ICD-10-CM | POA: Diagnosis not present

## 2016-09-09 DIAGNOSIS — Z7982 Long term (current) use of aspirin: Secondary | ICD-10-CM | POA: Insufficient documentation

## 2016-09-09 DIAGNOSIS — I724 Aneurysm of artery of lower extremity: Secondary | ICD-10-CM | POA: Insufficient documentation

## 2016-09-09 DIAGNOSIS — E785 Hyperlipidemia, unspecified: Secondary | ICD-10-CM | POA: Insufficient documentation

## 2016-09-09 DIAGNOSIS — I2510 Atherosclerotic heart disease of native coronary artery without angina pectoris: Secondary | ICD-10-CM | POA: Insufficient documentation

## 2016-09-09 DIAGNOSIS — Z9862 Peripheral vascular angioplasty status: Secondary | ICD-10-CM

## 2016-09-09 HISTORY — PX: ABDOMINAL AORTOGRAM W/LOWER EXTREMITY: CATH118223

## 2016-09-09 LAB — POCT I-STAT, CHEM 8
BUN: 22 mg/dL — AB (ref 6–20)
CHLORIDE: 104 mmol/L (ref 101–111)
Calcium, Ion: 1.1 mmol/L — ABNORMAL LOW (ref 1.15–1.40)
Creatinine, Ser: 0.9 mg/dL (ref 0.61–1.24)
GLUCOSE: 96 mg/dL (ref 65–99)
HCT: 36 % — ABNORMAL LOW (ref 39.0–52.0)
Hemoglobin: 12.2 g/dL — ABNORMAL LOW (ref 13.0–17.0)
Potassium: 4.1 mmol/L (ref 3.5–5.1)
SODIUM: 141 mmol/L (ref 135–145)
TCO2: 28 mmol/L (ref 0–100)

## 2016-09-09 SURGERY — ABDOMINAL AORTOGRAM W/LOWER EXTREMITY
Anesthesia: LOCAL

## 2016-09-09 MED ORDER — LIDOCAINE HCL 1 % IJ SOLN
INTRAMUSCULAR | Status: AC
  Filled 2016-09-09: qty 20

## 2016-09-09 MED ORDER — SODIUM CHLORIDE 0.9 % IV SOLN: 1.0000 mL/kg/h | INTRAVENOUS | Status: DC

## 2016-09-09 MED ORDER — LIDOCAINE HCL (PF) 1 % IJ SOLN
INTRAMUSCULAR | Status: DC | PRN
  Administered 2016-09-09: 18 mL

## 2016-09-09 MED ORDER — HYDRALAZINE HCL 20 MG/ML IJ SOLN
INTRAMUSCULAR | Status: AC
  Filled 2016-09-09: qty 1

## 2016-09-09 MED ORDER — HYDRALAZINE HCL 20 MG/ML IJ SOLN
INTRAMUSCULAR | Status: DC | PRN
  Administered 2016-09-09: 10 mg via INTRAVENOUS

## 2016-09-09 MED ORDER — HYDRALAZINE HCL 20 MG/ML IJ SOLN: 10.0000 mg | INTRAMUSCULAR | Status: DC | PRN

## 2016-09-09 MED ORDER — HEPARIN (PORCINE) IN NACL 2-0.9 UNIT/ML-% IJ SOLN
INTRAMUSCULAR | Status: AC | PRN
  Administered 2016-09-09: 1000 mL

## 2016-09-09 MED ORDER — HEPARIN (PORCINE) IN NACL 2-0.9 UNIT/ML-% IJ SOLN
INTRAMUSCULAR | Status: AC
  Filled 2016-09-09: qty 1000

## 2016-09-09 MED ORDER — IODIXANOL 320 MG/ML IV SOLN
INTRAVENOUS | Status: DC | PRN
  Administered 2016-09-09: 115 mL via INTRA_ARTERIAL

## 2016-09-09 MED ORDER — SODIUM CHLORIDE 0.9 % IV SOLN
INTRAVENOUS | Status: DC
  Administered 2016-09-09: 08:00:00 via INTRAVENOUS

## 2016-09-09 SURGICAL SUPPLY — 11 items
CATH ANGIO 5F PIGTAIL 65CM (CATHETERS) ×2 IMPLANT
CATH CROSS OVER TEMPO 5F (CATHETERS) ×2 IMPLANT
CATH STRAIGHT 5FR 65CM (CATHETERS) ×2 IMPLANT
KIT MICROINTRODUCER STIFF 5F (SHEATH) ×2 IMPLANT
KIT PV (KITS) ×2 IMPLANT
SHEATH PINNACLE 5F 10CM (SHEATH) ×2 IMPLANT
SHEATH PINNACLE R/O II 5F 6CM (SHEATH) IMPLANT
SYR MEDRAD MARK V 150ML (SYRINGE) ×2 IMPLANT
TRANSDUCER W/STOPCOCK (MISCELLANEOUS) ×2 IMPLANT
TRAY PV CATH (CUSTOM PROCEDURE TRAY) ×2 IMPLANT
WIRE HITORQ VERSACORE ST 145CM (WIRE) ×2 IMPLANT

## 2016-09-09 NOTE — Telephone Encounter (Signed)
Sched appt 03/15/17; lab at 11:00 and MD at 11:45. Mailed appt letter.

## 2016-09-09 NOTE — Op Note (Signed)
   PATIENT: Max Martinez Spindle      MRN: [id number] DOB: March 05, 1925    DATE OF PROCEDURE: 09/09/2016  INDICATIONS:    Max Martinez Bartunek is a 83 y.o. male with a left popliteal artery aneurysm. He has a history of dementia and the family did not wish an aggressive approach. However they wish to proceed with arteriography in order to determine what the options were. The thought was that if this became symptomatic or enlarged significantly and could potentially be treated with a covered stent that this might be an option. They did not wish any surgical options.  PROCEDURE:    1. Ultrasound-guided access to the right common femoral artery 2. Aortogram with bilateral iliac arteriogram 3. Selective catheterization of the left external iliac artery with left lower extremity runoff 4. Retrograde right femoral arteriogram with right lower extremity runoff  SURGEON: Di Kindlehristopher S. Edilia Boickson, MD, FACS  ANESTHESIA: Local   EBL: Minimal  TECHNIQUE: The patient was brought to the peripheral vascular lab and was not sedated given his age. Both groins were prepped and draped in usual sterile fashion. Under ultrasound guidance, after the skin was anesthetized, the right common femoral artery was cannulated with a micropuncture needle and a micropuncture sheath introduced over the wire. This was exchanged for a 5 JamaicaFrench sheath over a Bentson wire. A pigtail catheter was positioned at the L1 vertebral body and flush aortogram obtained. The catheter was in position above the aortic bifurcation and the pigtail catheter exchanged for a crossover catheter. This was positioned into the left common iliac artery area the guidewire was advanced into the common femoral artery and the crossover catheter exchanged for a straight catheter. Selective left external iliac arteriogram was obtained with left lower extremity runoff. The straight catheter was then removed and then a retrograde right femoral arteriogram was obtained with right  lower extremity runoff. At the completion of the procedure, the patient was transferred to the holding area for removal of the sheath. No immediate complications were noted.  FINDINGS:    1. There are single renal arteries bilaterally with no significant renal artery stenosis identified. The infrarenal aorta, bilateral common iliac arteries, bilateral external iliac arteries, and bilateral hypogastric arteries are patent.  2. On the left side the common femoral, deep femoral, and superficial femoral arteries are patent. There is aneurysmal disease of the popliteal artery is fairly smooth. The size aneurysm cannot be determined by the study. There is tibial artery occlusive disease. The anterior tibial artery has diffuse disease proximally and is patent distally but then occludes above the ankle. The peroneal artery is occluded. The posterior tibial artery has severe diffuse disease distally. 3. On the right side, the common femoral, superficial femoral, and deep femoral arteries are patent. There is smooth laminar flow throughout the popliteal artery. He has dominant peroneal runoff on the right. The posterior tibial artery has severe diffuse disease. The proximal anterior tibial arteries occluded with but reconstitutes the mid segment. The distal anterior tibial artery is occluded at the ankle.  Waverly Ferrarihristopher Gilma Bessette, MD, FACS [facility]  DATE OF DICTATION:   09/09/2016

## 2016-09-09 NOTE — Progress Notes (Addendum)
Site area: RFA Site Prior to Removal:  Level 0 Pressure Applied For:30 min Manual:  yes  Patient Status During Pull:  stable Post Pull Site:  Level Post Pull Instructions Given:  yes Post Pull Pulses Present: palpable Dressing Applied:tegaderm   Bedrest begins @ 1025 till 1425 Comments:

## 2016-09-09 NOTE — Telephone Encounter (Signed)
-----   Message from Sharee PimpleMarilyn K McChesney, RN sent at 09/09/2016 10:12 AM EDT ----- Regarding: schedule 6 months w/ LE duples   ----- Message ----- From: Chuck Hintickson, Christopher S, MD Sent: 09/09/2016   9:48 AM To: Vvs Charge Pool Subject: charge and f/u                                 PROCEDURE:   1. Ultrasound-guided access to the right common femoral artery 2. Aortogram with bilateral iliac arteriogram 3. Selective catheterization of the left external iliac artery with left lower extremity runoff 4. Retrograde right femoral arteriogram with right lower extremity runoff  He needs a follow up visit in 6 months with a duplex of his left popliteal artery. Thank you. CD

## 2016-09-09 NOTE — Discharge Instructions (Signed)
Femoral Site Care °Refer to this sheet in the next few weeks. These instructions provide you with information about caring for yourself after your procedure. Your health care provider may also give you more specific instructions. Your treatment has been planned according to current medical practices, but problems sometimes occur. Call your health care provider if you have any problems or questions after your procedure. °What can I expect after the procedure? °After your procedure, it is typical to have the following: °· Bruising at the site that usually fades within 1-2 weeks. °· Blood collecting in the tissue (hematoma) that may be painful to the touch. It should usually decrease in size and tenderness within 1-2 weeks. °Follow these instructions at home: °· Take medicines only as directed by your health care provider. °· You may shower 24-48 hours after the procedure or as directed by your health care provider. Remove the bandage (dressing) and gently wash the site with plain soap and water. Pat the area dry with a clean towel. Do not rub the site, because this may cause bleeding. °· Do not take baths, swim, or use a hot tub until your health care provider approves. °· Check your insertion site every day for redness, swelling, or drainage. °· Do not apply powder or lotion to the site. °· Limit use of stairs to twice a day for the first 2-3 days or as directed by your health care provider. °· Do not squat for the first 2-3 days or as directed by your health care provider. °· Do not lift over 10 lb (4.5 kg) for 5 days after your procedure or as directed by your health care provider. °· Ask your health care provider when it is okay to: °¨ Return to work or school. °¨ Resume usual physical activities or sports. °¨ Resume sexual activity. °· Do not drive home if you are discharged the same day as the procedure. Have someone else drive you. °· You may drive 24 hours after the procedure unless otherwise instructed by  your health care provider. °· Do not operate machinery or power tools for 24 hours after the procedure or as directed by your health care provider. °· If your procedure was done as an outpatient procedure, which means that you went home the same day as your procedure, a responsible adult should be with you for the first 24 hours after you arrive home. °· Keep all follow-up visits as directed by your health care provider. This is important. °Contact a health care provider if: °· You have a fever. °· You have chills. °· You have increased bleeding from the site. Hold pressure on the site. °Get help right away if: °· You have unusual pain at the site. °· You have redness, warmth, or swelling at the site. °· You have drainage (other than a small amount of blood on the dressing) from the site. °· The site is bleeding, and the bleeding does not stop after 30 minutes of holding steady pressure on the site. °· Your leg or foot becomes pale, cool, tingly, or numb. °This information is not intended to replace advice given to you by your health care provider. Make sure you discuss any questions you have with your health care provider. °Document Released: 12/17/2013 Document Revised: 09/21/2015 Document Reviewed: 11/02/2013 °Elsevier Interactive Patient Education © 2017 Else[employer] Inc. ° °

## 2016-09-09 NOTE — H&P (Signed)
Progress Notes Encounter Date: 08/07/2016 Chuck Hint, MD  Vascular Surgery    [] Hide copied text [] Hover for attribution information    Patient name: Max Martinez MRN: [id number]        DOB: 1924-09-19          Sex: male  REASON FOR ADMISSION: Left popliteal artery aneurysm  HPI: Max Martinez is a 83 y.o. male, who had presented with leg swelling and underwent a venous duplex scan of the left lower extremity. His duplex scan on 07/11/2016 showed no evidence of DVT of the left lower extremity. However an incidental finding was that he likely had a popliteal artery aneurysm this was difficult to visualize as the patient was moving and was confused.  The patient was set up for vascular consultation. He has significant dementia and I'm unable to obtain any significant history from the patient. The history is obtained from his daughter.  He did undergo transcatheter aortic valve replacement for aortic stenosis in 2016 at [facility] [place]. He has also had a previous knee replacement on the left in the remote past.  He is not a smoker. He denies any history of claudication, rest pain, or nonhealing ulcers.      Past Medical History:  Diagnosis Date  . A-fib (HCC) 01/21/2015  . Burn   . CHF (congestive heart failure) (HCC)   . Coronary artery disease   . Hyperlipidemia   . Hypertension   . Peripheral vascular disease (HCC)          Family History  Problem Relation Age of Onset  . CVA Father     SOCIAL HISTORY: Social History        Social History  . Marital status: Widowed    Spouse name: N/A  . Number of children: N/A  . Years of education: N/A      Occupational History  . Not on file.       Social History Main Topics  . Smoking status: Never Smoker  . Smokeless tobacco: Never Used  . Alcohol use No  . Drug use: No  . Sexual activity: Not on file       Other Topics Concern  . Not on file   Social History  Narrative  . No narrative on file    No Known Allergies        Current Outpatient Prescriptions  Medication Sig Dispense Refill  . aspirin EC 81 MG tablet Take 81 mg by mouth daily.    Marland Kitchen donepezil (ARICEPT) 5 MG tablet Take 5 mg by mouth at bedtime.    . finasteride (PROSCAR) 5 MG tablet TAKE 1 TABLET BY MOUTH DAILY 30 tablet 0  . furosemide (LASIX) 20 MG tablet Take 1 tablet (20 mg total) by mouth daily.    . Melatonin 3 MG TABS Take 1 tablet by mouth at bedtime.    . metoprolol tartrate (LOPRESSOR) 25 MG tablet Take 25 mg by mouth daily.    . potassium chloride SA (K-DUR,KLOR-CON) 20 MEQ tablet Take 1 tablet (20 mEq total) by mouth daily. 2 tablet 0  . pravastatin (PRAVACHOL) 40 MG tablet Take 40 mg by mouth daily.    . tamsulosin (FLOMAX) 0.4 MG CAPS capsule Take 0.4 mg by mouth daily after supper.     No current facility-administered medications for this visit.     REVIEW OF SYSTEMS:  [X]  denotes positive finding, [ ]  denotes negative finding Cardiac  Comments:  Chest pain or chest pressure:  Shortness of breath upon exertion:    Short of breath when lying flat:    Irregular heart rhythm:        Vascular    Pain in calf, thigh, or hip brought on by ambulation:    Pain in feet at night that wakes you up from your sleep:     Blood clot in your veins:    Leg swelling:         Pulmonary    Oxygen at home:    Productive cough:     Wheezing:         Neurologic    Sudden weakness in arms or legs:     Sudden numbness in arms or legs:     Sudden onset of difficulty speaking or slurred speech:    Temporary loss of vision in one eye:     Problems with dizziness:         Gastrointestinal    Blood in stool:     Vomited blood:         Genitourinary    Burning when urinating:     Blood in urine:        Psychiatric    Major depression:         Hematologic    Bleeding  problems:    Problems with blood clotting too easily:        Skin    Rashes or ulcers:        Constitutional    Fever or chills:      PHYSICAL EXAM:    Vitals:   08/07/16 1037  BP: (!) 115/56  Pulse: (!) 54  Resp: 20  Temp: 97.1 F (36.2 C)  TempSrc: Oral  SpO2: 96%  Weight: 162 lb (73.5 kg)  Height: 5\' 7"  (1.702 m)    GENERAL: The patient is a well-nourished male, in no acute distress. The vital signs are documented above. CARDIAC: There is a regular rate and rhythm.  VASCULAR: I do not detect carotid bruits. He has palpable femoral and popliteal pulses bilaterally. I cannot palpate pedal pulses. There is no evidence of atheroembolic disease to either lower extremity. He has mild left lower extremity swelling. PULMONARY: There is good air exchange bilaterally without wheezing or rales. ABDOMEN: Soft and non-tender with normal pitched bowel sounds. I do not appreciate an abdominal aortic aneurysm. MUSCULOSKELETAL: There are no major deformities or cyanosis. NEUROLOGIC: No focal weakness or paresthesias are detected. SKIN: There are no ulcers or rashes noted. PSYCHIATRIC: The patient has a normal affect.  DATA:   VENOUS DUPLEX: Venous duplex scan on 07/15/2016 showed no evidence of DVT of the left lower extremity with possibly a left popliteal artery aneurysm.  CT ANGIOGRAM AORTA WITH RUNOFF: I reviewed his CT angiogram that was done on 07/15/2016. This was a limited study as the patient had a left knee arthroplasty which limited visualization behind the knee. There appeared to be a large fusiform left popliteal artery aneurysm which measured 2.5 cm and might diameter. The right popliteal artery measured up to 1.8 cm in diameter.  Creatinine on 07/22/2016 was 1.0.  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 I would  normally recommend elective repair of the left pop to artery aneurysm. However, the situation is complicated by his age and dementia. The risk of not addressing this is acute occlusion of the aneurysm on the  left with the potential for limb threatening ischemia. The risk of addressing this is obviously the risk of the arteriogram and possible bypass. There is a small chance that he would be a candidate for placement of a covered stent. After extensive discussion with the daughter we agree that the safest approach would be to proceed with an arteriogram to at least further evaluate this and no oral options in the future. Currently she does not wish to pursue an aggressive approach to this with revascularization. Because of scheduling issues with the family she would like to schedule this in May and we will make those arrangements.  I have reviewed with the patient the indications for arteriography. In addition, I have reviewed the potential complications of arteriography including but not limited to: Bleeding, arterial injury, arterial thrombosis, dye action, renal insufficiency, or other unpredictable medical problems. I have explained to the patient that if we find disease amenable to placement of a covered stent, we could potentially address this at the same time.   The procedure is scheduled for May 14. The patient is not on anticoagulation.   Waverly Ferrari [facility] [place]

## 2016-11-11 ENCOUNTER — Encounter (HOSPITAL_COMMUNITY): Payer: Self-pay | Admitting: *Deleted

## 2016-11-11 ENCOUNTER — Telehealth: Payer: Self-pay | Admitting: Vascular Surgery

## 2016-11-11 ENCOUNTER — Emergency Department (HOSPITAL_COMMUNITY)
Admission: EM | Admit: 2016-11-11 | Discharge: 2016-11-12 | Disposition: A | Discharge status: Home / Self Care | Payer: Medicare HMO | Attending: Physician Assistant | Admitting: Physician Assistant

## 2016-11-11 DIAGNOSIS — Z7982 Long term (current) use of aspirin: Secondary | ICD-10-CM | POA: Diagnosis not present

## 2016-11-11 DIAGNOSIS — I110 Hypertensive heart disease with heart failure: Secondary | ICD-10-CM | POA: Diagnosis not present

## 2016-11-11 DIAGNOSIS — L03119 Cellulitis of unspecified part of limb: Secondary | ICD-10-CM | POA: Diagnosis not present

## 2016-11-11 DIAGNOSIS — L02419 Cutaneous abscess of limb, unspecified: Secondary | ICD-10-CM

## 2016-11-11 DIAGNOSIS — I5022 Chronic systolic (congestive) heart failure: Secondary | ICD-10-CM | POA: Insufficient documentation

## 2016-11-11 DIAGNOSIS — Z79899 Other long term (current) drug therapy: Secondary | ICD-10-CM | POA: Diagnosis not present

## 2016-11-11 DIAGNOSIS — L02415 Cutaneous abscess of right lower limb: Secondary | ICD-10-CM | POA: Diagnosis present

## 2016-11-11 MED ORDER — CLINDAMYCIN PHOSPHATE 600 MG/50ML IV SOLN
600.0000 mg | Freq: Once | INTRAVENOUS | Status: AC
  Administered 2016-11-11: 600 mg via INTRAVENOUS
  Filled 2016-11-11: qty 50

## 2016-11-11 MED ORDER — LIDOCAINE-EPINEPHRINE-TETRACAINE (LET) SOLUTION
3.0000 mL | Freq: Once | NASAL | Status: AC
  Administered 2016-11-11: 23:00:00 3 mL via TOPICAL
  Filled 2016-11-11: qty 3

## 2016-11-11 MED ORDER — LIDOCAINE HCL (PF) 1 % IJ SOLN
5.0000 mL | Freq: Once | INTRAMUSCULAR | Status: DC
  Filled 2016-11-11: qty 5

## 2016-11-11 NOTE — ED Provider Notes (Signed)
[facility]-EMERGENCY DEPT Provider Note   CSN: [id number] Arrival date & time: 11/11/16  1621     History   Chief Complaint Chief Complaint  Patient presents with  . Abscess    HPI   Blood pressure (!) 176/77, pulse 62, temperature 98.1 F (36.7 C), temperature source Oral, resp. rate 18, SpO2 99 %.  Max Martinez is a 83 y.o. male with past medical history significant for atrial fibrillation, CHF, dementia, accompanied by daughter who is his main caregiver complaining of abscess noted to right proximal thigh noticed yesterday. It started as a small pimple but has become much larger and actively draining purulent material. Patient is not diabetic. He is mentating at his baseline eating and drinking normally there has been no fever, nausea vomiting.  Past Medical History:  Diagnosis Date  . A-fib (HCC) 01/21/2015  . Burn   . CHF (congestive heart failure) (HCC)   . Coronary artery disease   . Hyperlipidemia   . Hypertension   . Peripheral vascular disease [facility](HCC)     Patient Active Problem List   Diagnosis Date Noted  . Sepsis due to Escherichia coli (E. coli) (HCC) 07/17/2016  . Hypertension, essential 07/10/2016  . 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 Coronary arteriosclerosis due to lipid rich plaque 07/10/2016  . S/P TAVR (transcatheter aortic valve replacement) 07/10/2016  . Chronic systolic CHF (congestive heart failure) (HCC) 07/10/2016  . Aortic stenosis 07/10/2016  . UGI bleed 01/22/2015  . Paroxysmal atrial fibrillation (HCC) 01/22/2015  . Thrombocytopenia (HCC) 12/03/2008    Past Surgical History:  Procedure Laterality Date  . ABDOMINAL AORTOGRAM W/LOWER EXTREMITY N/A 09/09/2016   Procedure: Abdominal Aortogram w/Lower Extremity;  Surgeon: Chuck Hintickson, Christopher S, MD;  [facility];  Service: Cardiovascular;  Laterality: N/A;  . SKIN GRAFT  x11. Aug-Dec 2010  . SUPRAVALVULAR AORTIC STENOSIS  REPAIR  2016       Home Medications    Prior to Admission medications   Medication Sig Start Date End Date Taking? Authorizing Provider  aspirin EC 81 MG tablet Take 81 mg by mouth daily.   Yes [provider]  donepezil (ARICEPT) 5 MG tablet Take 5 mg by mouth at bedtime.   Yes [provider]  finasteride (PROSCAR) 5 MG tablet TAKE 1 TABLET BY MOUTH DAILY Patient taking differently: TAKE 5MG  BY MOUTH DAILY 06/09/15  Yes Eubanks, Janene HarveyJessica K, NP  OVER THE COUNTER MEDICATION Apply 1 application topically 2 (two) times daily as needed (healed wounds). Zinc cream 14%   Yes [provider]  pravastatin (PRAVACHOL) 40 MG tablet Take 40 mg by mouth daily.   Yes [provider]  tamsulosin (FLOMAX) 0.4 MG CAPS capsule Take 0.4 mg by mouth daily after supper.   Yes [provider]  Wound Dressings (MEDIHONEY WOUND/BURN DRESSING) GEL Apply 1 application topically daily as needed (wound).   Yes [provider]  cephALEXin (KEFLEX) 500 MG capsule Take 1 capsule (500 mg total) by mouth 4 (four) times daily. 11/12/16   Charde Macfarlane, Joni ReiningNicole, PA-C    Family History Family History  Problem Relation Age of Onset  . CVA Father     Social History Social History  Substance Use Topics  . Smoking status: Never Smoker  . Smokeless tobacco: Never Used  . Alcohol use No     Allergies   Patient has no known allergies.   Review of Systems Review of Systems  A complete  review of systems was obtained and all systems are negative except as noted in the HPI and PMH.   Physical Exam Updated Vital Signs BP (!) 134/54   Pulse (!) 54   Temp 98.1 F (36.7 C) (Oral)   Resp 18   SpO2 95%   Physical Exam  Constitutional: He is oriented to person, place, and time. He appears well-developed and well-nourished. No distress.  HENT:  Head: Normocephalic and atraumatic.  Mouth/Throat: Oropharynx is clear and moist.  Eyes: Pupils are equal, round, and  reactive to light. Conjunctivae and EOM are normal.  Neck: Normal range of motion.  Cardiovascular: Normal rate, regular rhythm and intact distal pulses.   Pulmonary/Chest: Effort normal and breath sounds normal.  Abdominal: Soft. There is no tenderness.  Musculoskeletal: Normal range of motion.  Neurological: He is alert and oriented to person, place, and time.  Skin: He is not diaphoretic. There is erythema.  10 cm area of induration on the right proximal thigh with a actively draining 3 cm abscess. There is no tenderness in the prone a.m. there is no pain out of proportion or sub-cutaneous air.  Psychiatric: He has a normal mood and affect.  Nursing note and vitals reviewed.        ED Treatments / Results  Labs (all labs ordered are listed, but only abnormal results are displayed) Labs Reviewed  CBC WITH DIFFERENTIAL/PLATELET - Abnormal; Notable for the following:       Result Value   RBC 3.82 (*)    Hemoglobin 11.9 (*)    HCT 36.8 (*)    Platelets 139 (*)    All other components within normal limits  BASIC METABOLIC PANEL - Abnormal; Notable for the following:    Glucose, Bld 153 (*)    BUN 21 (*)    Calcium 8.5 (*)    All other components within normal limits    EKG  EKG Interpretation None       Radiology No results found.  Procedures .Marland KitchenIncision and Drainage Date/Time: 11/12/2016 8:29 AM Performed by: Wynetta Emery Authorized by: Wynetta Emery   Consent:    Consent given by:  Guardian   Risks discussed:  Bleeding   Alternatives discussed:  No treatment Location:    Type:  Abscess   Size:  3cm   Location:  Lower extremity Pre-procedure details:    Skin preparation:  Betadine Anesthesia (see MAR for exact dosages):    Anesthesia method:  Topical application and local infiltration   Topical anesthetic:  LET   Local anesthetic:  Lidocaine 1% w/o epi Procedure type:    Complexity:  Simple Procedure details:    Incision types:  Single  straight   Scalpel blade:  11   Wound management:  Probed and deloculated and irrigated with saline   Drainage:  Bloody   Drainage amount:  Scant   Wound treatment:  Wound left open   Packing materials:  None Post-procedure details:    Patient tolerance of procedure:  Tolerated well, no immediate complications   (including critical care time)  Medications Ordered in ED Medications  lidocaine-EPINEPHrine-tetracaine (LET) solution (3 mLs Topical Given 11/11/16 2254)  clindamycin (CLEOCIN) IVPB 600 mg (0 mg Intravenous Stopped 11/12/16 0043)     Initial Impression / Assessment and Plan / ED Course  I have reviewed the triage vital signs and the nursing notes.  Pertinent labs & imaging results that were available during my care of the patient were reviewed by me and considered in  my medical decision making (see chart for details).     Vitals:   11/11/16 1707 11/11/16 1931 11/11/16 2330 11/12/16 0000  BP: (!) 106/92 (!) 176/77 (!) 167/73 (!) 134/54  Pulse:  62 (!) 55 (!) 54  Resp:  18    Temp:  98.1 F (36.7 C)    TempSrc:  Oral    SpO2:  99% 100% 95%    Medications  lidocaine-EPINEPHrine-tetracaine (LET) solution (3 mLs Topical Given 11/11/16 2254)  clindamycin (CLEOCIN) IVPB 600 mg (0 mg Intravenous Stopped 11/12/16 0043)    BAYAN KUSHNIR is 84 y.o. male presenting with Abscess and cellulitis to right thigh. This does not extend into the Pyridium. Patient is mentating at his baseline as per his daughter who is his main caregiver. I and D performed and wound is dressed and patient is counseled on wound care and return precautions.  This is a shared visit with the attending physician who personally evaluated the patient and agrees with the care plan.   Evaluation does not show pathology that would require ongoing emergent intervention or inpatient treatment. Pt is hemodynamically stable and mentating appropriately. Discussed findings and plan with patient/guardian, who agrees  with care plan. All questions answered. Return precautions discussed and outpatient follow up given.      Final Clinical Impressions(s) / ED Diagnoses   Final diagnoses:  Cellulitis and abscess of leg, except foot    New Prescriptions Discharge Medication List as of 11/12/2016 12:23 AM    START taking these medications   Details  cephALEXin (KEFLEX) 500 MG capsule Take 1 capsule (500 mg total) by mouth 4 (four) times daily., Starting Tue 11/12/2016, Print         Chiffon Kittleson, Joni Reining, PA-C 11/12/16 0830    Mackuen, Cindee Salt, MD 11/13/16 [phone]

## 2016-11-11 NOTE — Telephone Encounter (Signed)
[facility]  Says Max Martinez has a bad abcess on his inner R thigh.  Pt has a raised 2cm area with bloody drainage, a 12cm bullseye area to the groin.  Thinks it may be MRSA.  The patient's daughter will take patient to the ED.

## 2016-11-11 NOTE — ED Triage Notes (Signed)
To ED for eval of right inner leg/groin abscess that has been increasing in size and pain level for past 3 days. First noticed Saturday morning as a pimple and now size of quarter.

## 2016-11-11 NOTE — ED Notes (Signed)
Applied LET to right inner thigh.

## 2016-11-12 LAB — BASIC METABOLIC PANEL
Anion gap: 9 (ref 5–15)
BUN: 21 mg/dL — AB (ref 6–20)
CHLORIDE: 105 mmol/L (ref 101–111)
CO2: 26 mmol/L (ref 22–32)
CREATININE: 0.96 mg/dL (ref 0.61–1.24)
Calcium: 8.5 mg/dL — ABNORMAL LOW (ref 8.9–10.3)
GFR calc Af Amer: >60 mL/min (ref >60)
GFR calc non Af Amer: >60 mL/min (ref >60)
Glucose, Bld: 153 mg/dL — ABNORMAL HIGH (ref 65–99)
Potassium: 3.9 mmol/L (ref 3.5–5.1)
SODIUM: 140 mmol/L (ref 135–145)

## 2016-11-12 LAB — CBC WITH DIFFERENTIAL/PLATELET
Basophils Absolute: 0 10*3/uL (ref 0.0–0.1)
Basophils Relative: 0 %
EOS ABS: 0.2 10*3/uL (ref 0.0–0.7)
Eosinophils Relative: 4 %
HEMATOCRIT: 36.8 % — AB (ref 39.0–52.0)
HEMOGLOBIN: 11.9 g/dL — AB (ref 13.0–17.0)
LYMPHS ABS: 1.4 10*3/uL (ref 0.7–4.0)
Lymphocytes Relative: 24 %
MCH: 31.2 pg (ref 26.0–34.0)
MCHC: 32.3 g/dL (ref 30.0–36.0)
MCV: 96.3 fL (ref 78.0–100.0)
MONOS PCT: 3 %
Monocytes Absolute: 0.2 10*3/uL (ref 0.1–1.0)
NEUTROS PCT: 69 %
Neutro Abs: 4 10*3/uL (ref 1.7–7.7)
Platelets: 139 10*3/uL — ABNORMAL LOW (ref 150–400)
RBC: 3.82 MIL/uL — ABNORMAL LOW (ref 4.22–5.81)
RDW: 13.9 % (ref 11.5–15.5)
WBC: 5.9 10*3/uL (ref 4.0–10.5)

## 2016-11-12 MED ORDER — CEPHALEXIN 500 MG PO CAPS: 500.0000 mg | ORAL_CAPSULE | Freq: Four times a day (QID) | ORAL | 0 refills | Status: AC

## 2016-11-12 NOTE — Discharge Instructions (Signed)
If you see signs of infection (warmth, redness, tenderness, pus, sharp increase in pain, fever, red streaking) immediately return to the emergency department. ° ° °

## 2016-11-12 NOTE — ED Notes (Signed)
Per main lab, no blood specimens received for pt. Will redraw labs and send to main lab.

## 2017-03-12 ENCOUNTER — Ambulatory Visit: Payer: Medicare HMO | Admitting: Vascular Surgery

## 2017-03-12 ENCOUNTER — Encounter (HOSPITAL_COMMUNITY): Payer: Medicare HMO

## 2017-08-27 DEATH — deceased

## 2018-02-14 IMAGING — CT CT ANGIO AOBIFEM WO/W CM
1 of 12 series · 3 of 16 positions shown, 4 images · IV contrast (OMNI 350)
Comparison: None.

CLINICAL DATA: 89-year-old male with a left popliteal artery
aneurysm

EXAM:
CT ANGIOGRAPHY OF ABDOMINAL AORTA WITH ILIOFEMORAL RUNOFF
TECHNIQUE: Multidetector CT imaging of the abdomen, pelvis and lower
extremities was performed using the standard protocol during bolus
administration of intravenous contrast. Multiplanar CT image
reconstructions and MIPs were obtained to evaluate the vascular
anatomy.
CONTRAST:  100 mL Isovue 370

[Series 5: cta runoff (id) · axial · 0.93mm/px · z∈[-1452,-15]mm · 3 of 480 slices shown, 4 images]
[im 1/480  soft-tissue]
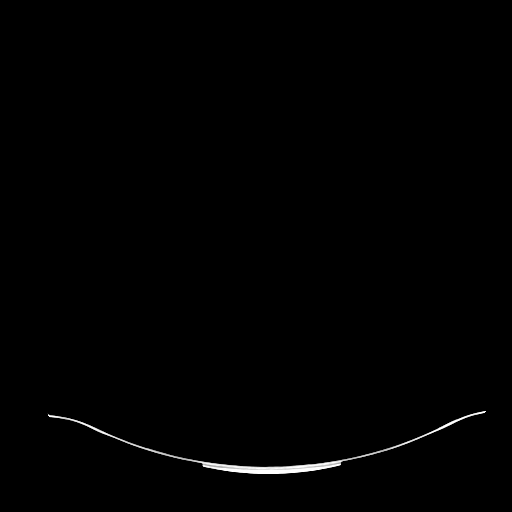
[im 1/480  bone]
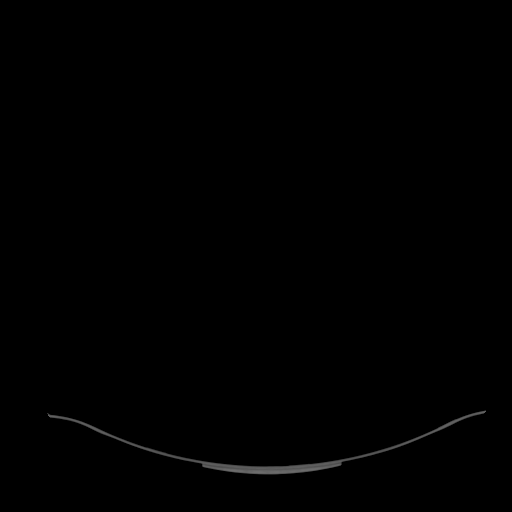
[im 240/480  soft-tissue]
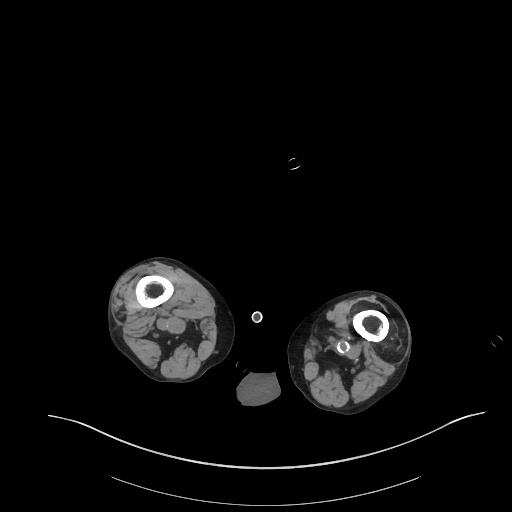
[im 480/480  soft-tissue]
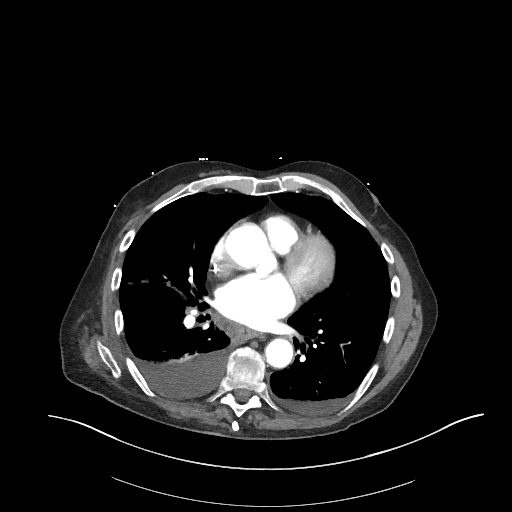

[3 of 16 positions shown; findings below may reference images not displayed]

FINDINGS: VASCULAR

Aorta: Minimal atherosclerotic plaque without evidence of aneurysm
or dissection.

Celiac: Patent without evidence of aneurysm, dissection, vasculitis
or significant stenosis.

SMA: Patent without evidence of aneurysm, dissection, vasculitis or
significant stenosis. Replaced right hepatic artery.

Renals: Single right renal artery with minimal atherosclerotic
plaque at the origin but no significant stenosis. Two left-sided
renal arteries. Heterogeneous atherosclerotic plaque at the origin
of the dominant renal artery results in mild to moderate focal
stenosis. Tiny accessory renal artery arising just inferior to the
main renal artery without evidence of stenosis.

IMA: Patent without evidence of aneurysm, dissection, vasculitis or
significant stenosis.

RIGHT Lower Extremity

Inflow: Mild atherosclerotic plaque without evidence of stenosis,
aneurysm or dissection.

Outflow: Calcified plaque along the posterior wall of the common
femoral artery without significant stenosis. The profunda femoral
artery is widely patent. Scattered atherosclerotic plaque throughout
the superficial femoral artery. Aneurysmal dilatation of the right
popliteal artery with a maximal diameter of 1.8 x 1.5 cm. Mild
smooth peripheral wall adherent thrombus. Contrast material does not
extend below the level of the popliteal artery.

Runoff: Very limited evaluation of the runoff artery secondary to
non opacification with contrast. There is significant peripheral
calcification.

LEFT Lower Extremity

Inflow: Heterogeneous atherosclerotic plaque without evidence of
stenosis, dissection or aneurysm.

Outflow: Mild calcified plaque along the posterior wall of the
common femoral artery without stenosis. Mild atherosclerotic plaque
throughout the superficial femoral artery without stenosis. The
profunda femoral artery is patent. Unfortunately, very limited
evaluation of the popliteal fossa secondary to severe streak
artifact related to the patient's total knee arthroplasty
prosthesis. There appears to be a large of fusiform aneurysmal
dilatation of the popliteal artery measuring up to approximately
cm on the sagittal reformatted views. There is heterogeneous density
throughout the aneurysm suggesting the presence of intramural
thrombus.

Runoff: Very limited evaluation of the runoff artery secondary to
non opacification with contrast. Atherosclerotic calcifications are
present within the vessels.

Veins: No obvious venous abnormality within the limitations of this
arterial phase study. Infrarenal IVC filter in place. This appears
to be Q-Ron Mollyrollypolly filter.

Review of the MIP images confirms the above findings.

NON-VASCULAR

Lower chest: Moderately large layering right pleural effusion and
small layering left pleural effusion. There is associated bilateral
lower lobe atelectasis. Mild centrilobular emphysema. No suspicious
mass or nodule. Mild cardiomegaly with evidence of prior
transarterial atrial valve replacement. No pericardial effusion.

Hepatobiliary: Normal hepatic contour and morphology. Several small
circumscribed low-attenuation lesions are too small for accurate
characterization but statistically highly likely to represent benign
cysts. The gallbladder is decompressed. No cholelithiasis or biliary
ductal dilatation.

Pancreas: Unremarkable. No pancreatic ductal dilatation or
surrounding inflammatory changes.

Spleen: Normal in size without focal abnormality.

Adrenals/Urinary Tract: Low-attenuation thickening of the left
adrenal gland most consistent with adrenal hyperplasia. The right
adrenal gland is also minimally thickened. No definite nodules.
cm simple cyst exophytic from the upper pole of the right kidney. 5
cm simple cyst exophytic from the interpolar left kidney. 3 cm
bilobed cyst arising from the medial aspect of the left upper pole.
Additional 1.8 cm cyst in the right lower pole. No enhancing renal
lesion, nephrolithiasis or hydronephrosis. Heavily trabeculated
bladder wall with numerous small cellules/ diverticulum along the
posterior aspect of the bladder. This is likely secondary to outlet
obstruction given massive prostatomegaly.

Stomach/Bowel: Large rectal stool burden as can be seen in the
setting of constipation. The stool burden borders on fecal
impaction. Colonic diverticular disease without CT evidence of
active inflammation. No focal bowel wall thickening or evidence
obstruction.

Lymphatic: No suspicious lymphadenopathy.

Reproductive: Massive prostatomegaly. The prostate gland measures
6.4 x 5.6 cm.

Other: Fat containing left indirect inguinal hernia. Small fat
containing right direct inguinal hernia.

Musculoskeletal: No acute fracture or aggressive appearing lytic or
blastic osseous lesion. Left total knee arthroplasty prosthesis.
L5-S1 degenerative disc disease.
IMPRESSION: VASCULAR

1. Limited CTA runoff due to multiple factors. There is limited
evaluation of the left popliteal fossa which is unfortunately the
region of clinical interest secondary to severe streak artifact from
the patient's left knee total arthroplasty prosthesis. Additionally,
the contrast bolus tapers out bilaterally in the region of the
popliteal arteries with no contrast opacification in the distal
popliteal or runoff arteries. This is likely due to low cardiac
output and delayed transit of the contrast bolus.
2. Incompletely imaged large fusiform left popliteal artery
aneurysm. By my best estimate, the aneurysm measures at least 3.5 cm
in diameter. There is heterogeneous density within the aneurysm
suggesting wall adherent mural thrombus. Unfortunately, I cannot
comment if the popliteal artery is patent nor completely thrombosed
secondary to the limitations described above.
3. Aneurysmal dilatation of the right popliteal artery measuring up
to 1.8 cm. There is smooth peripheral wall adherent thrombus in the
aneurysmal segment.
4. No evidence of aortic or other arterial aneurysm. Overall, the
patient's vessels are globally ectatic.
5. Diffuse atherosclerotic vascular calcification without
hemodynamically significant stenosis.
6. Mild -moderate focal stenosis at the origin of the left dominant
renal artery.
7. Cardiomegaly with evidence of prior transarterial aortic valve
replacement.

NON-VASCULAR

1. Massive prostatomegaly likely resulting in bladder outlet
obstruction with a heavily trabeculated bladder and numerous small
bladder diverticulum versus changes of the cystitis
cystica/glandularis along the posterior wall. Consider urology
referral for further evaluation.
2. Moderate right and small left layering pleural effusions with
associated lower lobe atelectasis.
3. Colonic diverticular disease without CT evidence of active
inflammation.
4. Renal and probable hepatic cysts
5. Bilateral left greater than right adrenal gland hyperplasia
6. Fat containing left indirect inguinal hernia and smaller fat
containing right direct inguinal hernia.
7. Left total knee arthroplasty.
8. Large colonic stool burden consistent with constipation.
